# Patient Record
Sex: Female | Born: 1945
Health system: Southern US, Community
[De-identification: ages and names within clinical notes are randomized; demographics above are authoritative.]

## PROBLEM LIST (undated history)

## (undated) DIAGNOSIS — I10 Essential (primary) hypertension: Secondary | ICD-10-CM

## (undated) DIAGNOSIS — E119 Type 2 diabetes mellitus without complications: Secondary | ICD-10-CM

## (undated) DIAGNOSIS — I2510 Atherosclerotic heart disease of native coronary artery without angina pectoris: Secondary | ICD-10-CM

## (undated) DIAGNOSIS — E785 Hyperlipidemia, unspecified: Secondary | ICD-10-CM

## (undated) HISTORY — DX: Type 2 diabetes mellitus without complications: E11.9

## (undated) HISTORY — DX: Essential (primary) hypertension: I10

## (undated) HISTORY — DX: Atherosclerotic heart disease of native coronary artery without angina pectoris: I25.10

## (undated) HISTORY — DX: Hyperlipidemia, unspecified: E78.5

---

## 2012-02-10 ENCOUNTER — Other Ambulatory Visit: Payer: Self-pay | Admitting: Physician Assistant

## 2012-05-28 ENCOUNTER — Other Ambulatory Visit: Payer: Self-pay | Admitting: Physician Assistant

## 2012-05-28 NOTE — Telephone Encounter (Signed)
This is a not a patient here at [facility].

## 2012-05-28 NOTE — Telephone Encounter (Signed)
Please pull patient chart 

## 2016-09-11 DIAGNOSIS — Z09 Encounter for follow-up examination after completed treatment for conditions other than malignant neoplasm: Secondary | ICD-10-CM | POA: Diagnosis not present

## 2016-09-11 DIAGNOSIS — R920 Mammographic microcalcification found on diagnostic imaging of breast: Secondary | ICD-10-CM | POA: Diagnosis not present

## 2016-09-11 DIAGNOSIS — R928 Other abnormal and inconclusive findings on diagnostic imaging of breast: Secondary | ICD-10-CM | POA: Diagnosis not present

## 2016-09-17 DIAGNOSIS — Z85828 Personal history of other malignant neoplasm of skin: Secondary | ICD-10-CM | POA: Diagnosis not present

## 2016-09-17 DIAGNOSIS — D225 Melanocytic nevi of trunk: Secondary | ICD-10-CM | POA: Diagnosis not present

## 2016-09-17 DIAGNOSIS — L814 Other melanin hyperpigmentation: Secondary | ICD-10-CM | POA: Diagnosis not present

## 2016-09-17 DIAGNOSIS — L821 Other seborrheic keratosis: Secondary | ICD-10-CM | POA: Diagnosis not present

## 2016-09-17 DIAGNOSIS — D1801 Hemangioma of skin and subcutaneous tissue: Secondary | ICD-10-CM | POA: Diagnosis not present

## 2016-10-18 DIAGNOSIS — J111 Influenza due to unidentified influenza virus with other respiratory manifestations: Secondary | ICD-10-CM | POA: Diagnosis not present

## 2016-11-08 DIAGNOSIS — H524 Presbyopia: Secondary | ICD-10-CM | POA: Diagnosis not present

## 2016-11-08 DIAGNOSIS — H2513 Age-related nuclear cataract, bilateral: Secondary | ICD-10-CM | POA: Diagnosis not present

## 2016-11-08 DIAGNOSIS — E119 Type 2 diabetes mellitus without complications: Secondary | ICD-10-CM | POA: Diagnosis not present

## 2016-11-08 DIAGNOSIS — H5203 Hypermetropia, bilateral: Secondary | ICD-10-CM | POA: Diagnosis not present

## 2016-11-08 DIAGNOSIS — H52223 Regular astigmatism, bilateral: Secondary | ICD-10-CM | POA: Diagnosis not present

## 2016-11-08 DIAGNOSIS — H33052 Total retinal detachment, left eye: Secondary | ICD-10-CM | POA: Diagnosis not present

## 2016-11-08 DIAGNOSIS — H40052 Ocular hypertension, left eye: Secondary | ICD-10-CM | POA: Diagnosis not present

## 2017-01-07 DIAGNOSIS — R8290 Unspecified abnormal findings in urine: Secondary | ICD-10-CM | POA: Diagnosis not present

## 2017-01-07 DIAGNOSIS — I10 Essential (primary) hypertension: Secondary | ICD-10-CM | POA: Diagnosis not present

## 2017-01-07 DIAGNOSIS — E119 Type 2 diabetes mellitus without complications: Secondary | ICD-10-CM | POA: Diagnosis not present

## 2017-01-07 DIAGNOSIS — E782 Mixed hyperlipidemia: Secondary | ICD-10-CM | POA: Diagnosis not present

## 2017-01-07 DIAGNOSIS — R69 Illness, unspecified: Secondary | ICD-10-CM | POA: Diagnosis not present

## 2017-01-07 DIAGNOSIS — E669 Obesity, unspecified: Secondary | ICD-10-CM | POA: Diagnosis not present

## 2017-01-07 DIAGNOSIS — R319 Hematuria, unspecified: Secondary | ICD-10-CM | POA: Diagnosis not present

## 2017-02-05 DIAGNOSIS — R319 Hematuria, unspecified: Secondary | ICD-10-CM | POA: Diagnosis not present

## 2017-02-05 DIAGNOSIS — N390 Urinary tract infection, site not specified: Secondary | ICD-10-CM | POA: Diagnosis not present

## 2017-02-18 DIAGNOSIS — H2513 Age-related nuclear cataract, bilateral: Secondary | ICD-10-CM | POA: Diagnosis not present

## 2017-02-18 DIAGNOSIS — H33052 Total retinal detachment, left eye: Secondary | ICD-10-CM | POA: Diagnosis not present

## 2017-02-18 DIAGNOSIS — H40052 Ocular hypertension, left eye: Secondary | ICD-10-CM | POA: Diagnosis not present

## 2017-02-28 DIAGNOSIS — N390 Urinary tract infection, site not specified: Secondary | ICD-10-CM | POA: Diagnosis not present

## 2017-07-23 DIAGNOSIS — H02824 Cysts of left upper eyelid: Secondary | ICD-10-CM | POA: Diagnosis not present

## 2017-07-23 DIAGNOSIS — Z23 Encounter for immunization: Secondary | ICD-10-CM | POA: Diagnosis not present

## 2017-07-23 DIAGNOSIS — I10 Essential (primary) hypertension: Secondary | ICD-10-CM | POA: Diagnosis not present

## 2017-07-23 DIAGNOSIS — E119 Type 2 diabetes mellitus without complications: Secondary | ICD-10-CM | POA: Diagnosis not present

## 2017-07-23 DIAGNOSIS — E782 Mixed hyperlipidemia: Secondary | ICD-10-CM | POA: Diagnosis not present

## 2017-07-30 DIAGNOSIS — H33052 Total retinal detachment, left eye: Secondary | ICD-10-CM | POA: Diagnosis not present

## 2017-07-30 DIAGNOSIS — H40052 Ocular hypertension, left eye: Secondary | ICD-10-CM | POA: Diagnosis not present

## 2017-07-30 DIAGNOSIS — H2513 Age-related nuclear cataract, bilateral: Secondary | ICD-10-CM | POA: Diagnosis not present

## 2017-07-30 DIAGNOSIS — H0014 Chalazion left upper eyelid: Secondary | ICD-10-CM | POA: Diagnosis not present

## 2017-09-23 DIAGNOSIS — L309 Dermatitis, unspecified: Secondary | ICD-10-CM | POA: Diagnosis not present

## 2017-09-23 DIAGNOSIS — D1801 Hemangioma of skin and subcutaneous tissue: Secondary | ICD-10-CM | POA: Diagnosis not present

## 2017-09-23 DIAGNOSIS — Z23 Encounter for immunization: Secondary | ICD-10-CM | POA: Diagnosis not present

## 2017-09-23 DIAGNOSIS — L821 Other seborrheic keratosis: Secondary | ICD-10-CM | POA: Diagnosis not present

## 2017-09-23 DIAGNOSIS — Z85828 Personal history of other malignant neoplasm of skin: Secondary | ICD-10-CM | POA: Diagnosis not present

## 2017-09-23 DIAGNOSIS — L814 Other melanin hyperpigmentation: Secondary | ICD-10-CM | POA: Diagnosis not present

## 2017-09-23 DIAGNOSIS — D225 Melanocytic nevi of trunk: Secondary | ICD-10-CM | POA: Diagnosis not present

## 2017-10-27 DIAGNOSIS — E119 Type 2 diabetes mellitus without complications: Secondary | ICD-10-CM | POA: Diagnosis not present

## 2017-10-28 DIAGNOSIS — E782 Mixed hyperlipidemia: Secondary | ICD-10-CM | POA: Diagnosis not present

## 2017-10-28 DIAGNOSIS — E119 Type 2 diabetes mellitus without complications: Secondary | ICD-10-CM | POA: Diagnosis not present

## 2017-10-28 DIAGNOSIS — I10 Essential (primary) hypertension: Secondary | ICD-10-CM | POA: Diagnosis not present

## 2017-10-28 DIAGNOSIS — Z7984 Long term (current) use of oral hypoglycemic drugs: Secondary | ICD-10-CM | POA: Diagnosis not present

## 2017-10-28 DIAGNOSIS — M2560 Stiffness of unspecified joint, not elsewhere classified: Secondary | ICD-10-CM | POA: Diagnosis not present

## 2017-11-18 DIAGNOSIS — H33052 Total retinal detachment, left eye: Secondary | ICD-10-CM | POA: Diagnosis not present

## 2017-11-18 DIAGNOSIS — H2513 Age-related nuclear cataract, bilateral: Secondary | ICD-10-CM | POA: Diagnosis not present

## 2017-11-18 DIAGNOSIS — H35033 Hypertensive retinopathy, bilateral: Secondary | ICD-10-CM | POA: Diagnosis not present

## 2017-11-18 DIAGNOSIS — H524 Presbyopia: Secondary | ICD-10-CM | POA: Diagnosis not present

## 2017-11-18 DIAGNOSIS — H40052 Ocular hypertension, left eye: Secondary | ICD-10-CM | POA: Diagnosis not present

## 2017-11-25 DIAGNOSIS — Z1231 Encounter for screening mammogram for malignant neoplasm of breast: Secondary | ICD-10-CM | POA: Diagnosis not present

## 2018-02-27 DIAGNOSIS — Z6837 Body mass index (BMI) 37.0-37.9, adult: Secondary | ICD-10-CM | POA: Diagnosis not present

## 2018-02-27 DIAGNOSIS — E1169 Type 2 diabetes mellitus with other specified complication: Secondary | ICD-10-CM | POA: Diagnosis not present

## 2018-02-27 DIAGNOSIS — Z79899 Other long term (current) drug therapy: Secondary | ICD-10-CM | POA: Diagnosis not present

## 2018-02-27 DIAGNOSIS — E7800 Pure hypercholesterolemia, unspecified: Secondary | ICD-10-CM | POA: Diagnosis not present

## 2018-02-27 DIAGNOSIS — R69 Illness, unspecified: Secondary | ICD-10-CM | POA: Diagnosis not present

## 2018-02-27 DIAGNOSIS — I10 Essential (primary) hypertension: Secondary | ICD-10-CM | POA: Diagnosis not present

## 2018-02-27 DIAGNOSIS — Z23 Encounter for immunization: Secondary | ICD-10-CM | POA: Diagnosis not present

## 2018-02-27 DIAGNOSIS — J301 Allergic rhinitis due to pollen: Secondary | ICD-10-CM | POA: Diagnosis not present

## 2018-02-27 DIAGNOSIS — M545 Low back pain: Secondary | ICD-10-CM | POA: Diagnosis not present

## 2018-06-18 DIAGNOSIS — E1169 Type 2 diabetes mellitus with other specified complication: Secondary | ICD-10-CM | POA: Diagnosis not present

## 2018-06-18 DIAGNOSIS — E538 Deficiency of other specified B group vitamins: Secondary | ICD-10-CM | POA: Diagnosis not present

## 2018-06-18 DIAGNOSIS — E7800 Pure hypercholesterolemia, unspecified: Secondary | ICD-10-CM | POA: Diagnosis not present

## 2018-06-18 DIAGNOSIS — Z23 Encounter for immunization: Secondary | ICD-10-CM | POA: Diagnosis not present

## 2018-06-18 DIAGNOSIS — M8580 Other specified disorders of bone density and structure, unspecified site: Secondary | ICD-10-CM | POA: Diagnosis not present

## 2018-06-18 DIAGNOSIS — I10 Essential (primary) hypertension: Secondary | ICD-10-CM | POA: Diagnosis not present

## 2018-06-18 DIAGNOSIS — Z6837 Body mass index (BMI) 37.0-37.9, adult: Secondary | ICD-10-CM | POA: Diagnosis not present

## 2018-06-18 DIAGNOSIS — K219 Gastro-esophageal reflux disease without esophagitis: Secondary | ICD-10-CM | POA: Diagnosis not present

## 2018-09-29 DIAGNOSIS — D225 Melanocytic nevi of trunk: Secondary | ICD-10-CM | POA: Diagnosis not present

## 2018-09-29 DIAGNOSIS — C44612 Basal cell carcinoma of skin of right upper limb, including shoulder: Secondary | ICD-10-CM | POA: Diagnosis not present

## 2018-09-29 DIAGNOSIS — Z23 Encounter for immunization: Secondary | ICD-10-CM | POA: Diagnosis not present

## 2018-09-29 DIAGNOSIS — C4401 Basal cell carcinoma of skin of lip: Secondary | ICD-10-CM | POA: Diagnosis not present

## 2018-09-29 DIAGNOSIS — D485 Neoplasm of uncertain behavior of skin: Secondary | ICD-10-CM | POA: Diagnosis not present

## 2018-09-29 DIAGNOSIS — Z85828 Personal history of other malignant neoplasm of skin: Secondary | ICD-10-CM | POA: Diagnosis not present

## 2018-09-29 DIAGNOSIS — L570 Actinic keratosis: Secondary | ICD-10-CM | POA: Diagnosis not present

## 2018-09-29 DIAGNOSIS — L821 Other seborrheic keratosis: Secondary | ICD-10-CM | POA: Diagnosis not present

## 2018-09-29 DIAGNOSIS — L814 Other melanin hyperpigmentation: Secondary | ICD-10-CM | POA: Diagnosis not present

## 2018-10-26 DIAGNOSIS — C44612 Basal cell carcinoma of skin of right upper limb, including shoulder: Secondary | ICD-10-CM | POA: Diagnosis not present

## 2018-10-26 DIAGNOSIS — D485 Neoplasm of uncertain behavior of skin: Secondary | ICD-10-CM | POA: Diagnosis not present

## 2018-10-26 DIAGNOSIS — L905 Scar conditions and fibrosis of skin: Secondary | ICD-10-CM | POA: Diagnosis not present

## 2018-12-21 DIAGNOSIS — L905 Scar conditions and fibrosis of skin: Secondary | ICD-10-CM | POA: Diagnosis not present

## 2018-12-21 DIAGNOSIS — C44612 Basal cell carcinoma of skin of right upper limb, including shoulder: Secondary | ICD-10-CM | POA: Diagnosis not present

## 2018-12-29 DIAGNOSIS — M545 Low back pain: Secondary | ICD-10-CM | POA: Diagnosis not present

## 2018-12-29 DIAGNOSIS — Z85828 Personal history of other malignant neoplasm of skin: Secondary | ICD-10-CM | POA: Diagnosis not present

## 2018-12-29 DIAGNOSIS — I10 Essential (primary) hypertension: Secondary | ICD-10-CM | POA: Diagnosis not present

## 2018-12-29 DIAGNOSIS — E1169 Type 2 diabetes mellitus with other specified complication: Secondary | ICD-10-CM | POA: Diagnosis not present

## 2018-12-29 DIAGNOSIS — Z7984 Long term (current) use of oral hypoglycemic drugs: Secondary | ICD-10-CM | POA: Diagnosis not present

## 2018-12-29 DIAGNOSIS — Z974 Presence of external hearing-aid: Secondary | ICD-10-CM | POA: Diagnosis not present

## 2018-12-29 DIAGNOSIS — E7800 Pure hypercholesterolemia, unspecified: Secondary | ICD-10-CM | POA: Diagnosis not present

## 2019-01-28 DIAGNOSIS — C4401 Basal cell carcinoma of skin of lip: Secondary | ICD-10-CM | POA: Diagnosis not present

## 2019-03-03 DIAGNOSIS — H2513 Age-related nuclear cataract, bilateral: Secondary | ICD-10-CM | POA: Diagnosis not present

## 2019-03-03 DIAGNOSIS — E119 Type 2 diabetes mellitus without complications: Secondary | ICD-10-CM | POA: Diagnosis not present

## 2019-03-03 DIAGNOSIS — H33052 Total retinal detachment, left eye: Secondary | ICD-10-CM | POA: Diagnosis not present

## 2019-03-03 DIAGNOSIS — H40052 Ocular hypertension, left eye: Secondary | ICD-10-CM | POA: Diagnosis not present

## 2019-03-29 DIAGNOSIS — R69 Illness, unspecified: Secondary | ICD-10-CM | POA: Diagnosis not present

## 2019-04-20 DIAGNOSIS — Z1239 Encounter for other screening for malignant neoplasm of breast: Secondary | ICD-10-CM | POA: Diagnosis not present

## 2019-04-20 DIAGNOSIS — Z1231 Encounter for screening mammogram for malignant neoplasm of breast: Secondary | ICD-10-CM | POA: Diagnosis not present

## 2019-05-20 DIAGNOSIS — R69 Illness, unspecified: Secondary | ICD-10-CM | POA: Diagnosis not present

## 2019-06-15 DIAGNOSIS — K219 Gastro-esophageal reflux disease without esophagitis: Secondary | ICD-10-CM | POA: Diagnosis not present

## 2019-06-15 DIAGNOSIS — Z79899 Other long term (current) drug therapy: Secondary | ICD-10-CM | POA: Diagnosis not present

## 2019-06-15 DIAGNOSIS — E1169 Type 2 diabetes mellitus with other specified complication: Secondary | ICD-10-CM | POA: Diagnosis not present

## 2019-06-15 DIAGNOSIS — Z6838 Body mass index (BMI) 38.0-38.9, adult: Secondary | ICD-10-CM | POA: Diagnosis not present

## 2019-06-15 DIAGNOSIS — E7800 Pure hypercholesterolemia, unspecified: Secondary | ICD-10-CM | POA: Diagnosis not present

## 2019-06-15 DIAGNOSIS — Z1159 Encounter for screening for other viral diseases: Secondary | ICD-10-CM | POA: Diagnosis not present

## 2019-06-15 DIAGNOSIS — I10 Essential (primary) hypertension: Secondary | ICD-10-CM | POA: Diagnosis not present

## 2019-09-07 ENCOUNTER — Ambulatory Visit: Payer: Medicare Other | Attending: Internal Medicine

## 2019-09-07 DIAGNOSIS — Z23 Encounter for immunization: Secondary | ICD-10-CM | POA: Insufficient documentation

## 2019-09-07 NOTE — Progress Notes (Signed)
   Covid-19 Vaccination Clinic  Name:  Cindy Bautista    MRN: [id number] DOB: 07-20-1946  09/07/2019  [name] was observed post Covid-19 immunization for 15 minutes without incidence. She was provided with Vaccine Information Sheet and instruction to access the V-Safe system.   [name] was instructed to call 911 with any severe reactions post vaccine: Marland Kitchen Difficulty breathing  . Swelling of your face and throat  . A fast heartbeat  . A bad rash all over your body  . Dizziness and weakness    Immunizations Administered    Name Date Dose VIS Date Route   Pfizer COVID-19 Vaccine 09/07/2019  3:48 PM 0.3 mL 07/30/2019 Intramuscular   Manufacturer: [place]   Lot: [id number]   [place]: [id number]

## 2019-09-28 ENCOUNTER — Ambulatory Visit: Payer: Self-pay

## 2019-09-29 ENCOUNTER — Ambulatory Visit: Payer: Medicare HMO | Attending: Internal Medicine

## 2019-09-29 DIAGNOSIS — Z23 Encounter for immunization: Secondary | ICD-10-CM | POA: Insufficient documentation

## 2019-09-29 NOTE — Progress Notes (Signed)
   Covid-19 Vaccination Clinic  Name:  DANYELLE KEAL    MRN: [id number] DOB: Mar 26, 1946  09/29/2019  Ms. Turlington was observed post Covid-19 immunization for 15 minutes without incidence. She was provided with Vaccine Information Sheet and instruction to access the V-Safe system.   Ms. Totzke was instructed to call 911 with any severe reactions post vaccine: Marland Kitchen Difficulty breathing  . Swelling of your face and throat  . A fast heartbeat  . A bad rash all over your body  . Dizziness and weakness    Immunizations Administered    Name Date Dose VIS Date Route   Pfizer COVID-19 Vaccine 09/29/2019  8:31 AM 0.3 mL 07/30/2019 Intramuscular   Manufacturer: [place]   Lot: [id number]   [place]: [id number]

## 2019-10-05 DIAGNOSIS — R69 Illness, unspecified: Secondary | ICD-10-CM | POA: Diagnosis not present

## 2019-10-20 DIAGNOSIS — H31092 Other chorioretinal scars, left eye: Secondary | ICD-10-CM | POA: Diagnosis not present

## 2019-10-20 DIAGNOSIS — E119 Type 2 diabetes mellitus without complications: Secondary | ICD-10-CM | POA: Diagnosis not present

## 2019-10-20 DIAGNOSIS — H2513 Age-related nuclear cataract, bilateral: Secondary | ICD-10-CM | POA: Diagnosis not present

## 2019-10-20 DIAGNOSIS — H35033 Hypertensive retinopathy, bilateral: Secondary | ICD-10-CM | POA: Diagnosis not present

## 2019-11-02 DIAGNOSIS — H2513 Age-related nuclear cataract, bilateral: Secondary | ICD-10-CM | POA: Diagnosis not present

## 2019-11-02 DIAGNOSIS — H2512 Age-related nuclear cataract, left eye: Secondary | ICD-10-CM | POA: Diagnosis not present

## 2019-11-16 DIAGNOSIS — H2512 Age-related nuclear cataract, left eye: Secondary | ICD-10-CM | POA: Diagnosis not present

## 2019-11-17 DIAGNOSIS — D485 Neoplasm of uncertain behavior of skin: Secondary | ICD-10-CM | POA: Diagnosis not present

## 2019-11-17 DIAGNOSIS — L578 Other skin changes due to chronic exposure to nonionizing radiation: Secondary | ICD-10-CM | POA: Diagnosis not present

## 2019-11-17 DIAGNOSIS — D225 Melanocytic nevi of trunk: Secondary | ICD-10-CM | POA: Diagnosis not present

## 2019-11-17 DIAGNOSIS — Z85828 Personal history of other malignant neoplasm of skin: Secondary | ICD-10-CM | POA: Diagnosis not present

## 2019-11-17 DIAGNOSIS — C44519 Basal cell carcinoma of skin of other part of trunk: Secondary | ICD-10-CM | POA: Diagnosis not present

## 2019-11-17 DIAGNOSIS — L821 Other seborrheic keratosis: Secondary | ICD-10-CM | POA: Diagnosis not present

## 2019-11-17 DIAGNOSIS — L814 Other melanin hyperpigmentation: Secondary | ICD-10-CM | POA: Diagnosis not present

## 2019-11-17 DIAGNOSIS — C44619 Basal cell carcinoma of skin of left upper limb, including shoulder: Secondary | ICD-10-CM | POA: Diagnosis not present

## 2019-11-22 DIAGNOSIS — H2511 Age-related nuclear cataract, right eye: Secondary | ICD-10-CM | POA: Diagnosis not present

## 2019-11-30 DIAGNOSIS — H2511 Age-related nuclear cataract, right eye: Secondary | ICD-10-CM | POA: Diagnosis not present

## 2019-11-30 DIAGNOSIS — H25811 Combined forms of age-related cataract, right eye: Secondary | ICD-10-CM | POA: Diagnosis not present

## 2019-12-02 DIAGNOSIS — L905 Scar conditions and fibrosis of skin: Secondary | ICD-10-CM | POA: Diagnosis not present

## 2019-12-13 DIAGNOSIS — C44519 Basal cell carcinoma of skin of other part of trunk: Secondary | ICD-10-CM | POA: Diagnosis not present

## 2019-12-13 DIAGNOSIS — L820 Inflamed seborrheic keratosis: Secondary | ICD-10-CM | POA: Diagnosis not present

## 2019-12-14 DIAGNOSIS — R0789 Other chest pain: Secondary | ICD-10-CM | POA: Diagnosis not present

## 2019-12-14 DIAGNOSIS — Z974 Presence of external hearing-aid: Secondary | ICD-10-CM | POA: Diagnosis not present

## 2019-12-14 DIAGNOSIS — I10 Essential (primary) hypertension: Secondary | ICD-10-CM | POA: Diagnosis not present

## 2019-12-14 DIAGNOSIS — E1169 Type 2 diabetes mellitus with other specified complication: Secondary | ICD-10-CM | POA: Diagnosis not present

## 2019-12-14 DIAGNOSIS — E7800 Pure hypercholesterolemia, unspecified: Secondary | ICD-10-CM | POA: Diagnosis not present

## 2019-12-14 DIAGNOSIS — K219 Gastro-esophageal reflux disease without esophagitis: Secondary | ICD-10-CM | POA: Diagnosis not present

## 2019-12-23 NOTE — Progress Notes (Signed)
Cardiology Office Note:   Date:  12/24/2019  NAME:  Cindy Bautista    MRN: [id number] DOB:  08-Nov-1945   PCP:  Kathyrn Lass, MD  Cardiologist:  No primary care provider on file.   Referring MD: Kathyrn Lass, MD   Chief Complaint  Patient presents with  . Chest Pain   History of Present Illness:   Cindy Bautista is a 74 y.o. female with a hx of DM, HTN, HLD who is being seen today for the evaluation of chest pain at the request of Kathyrn Lass, MD.  She is a long history of diabetes.  She is on oral hypoglycemic agents with an A1c of eight.  She reports over the last 2 to 3 months she is developed worsening shortness of breath with heavy exertion.  She reports activity such as climbing hills do get her quite winded.  She apparently has been working out pretty extensively during the coronavirus pandemic.  This includes walking 2 to 2.5 miles per day three times a week.  She also does Zumba three times a week.  She reports that when she does heavy exertion she does get winded easily.  She is also had some tightness in her chest that is associated with heavy exertion.  Given her diabetes and family history of heart disease, she is quite concerned about her heart.  Her EKG today demonstrates an incomplete right bundle branch block with left axis deviation.  She reports that her son had a heart attack at age 22.  She also has several family members who had heart disease around her age.  Overall she is concerned.  She is a never smoker.  She consumes alcohol in moderation.  She does not use any illicit drugs.  She is active.  Diabetes and LDL management reviewed below.  She is on a statin.  Problem List 1. HTN 2. DM -A1c 8.0 3. HLD -Total cholesterol 158, triglycerides 212, LDL 72, HDL 115  Past Medical History: Past Medical History:  Diagnosis Date  . Diabetes mellitus without complication ([place])   . Hyperlipidemia   . Hypertension     Past Surgical History: Past Surgical History:   Procedure Laterality Date  . CESAREAN SECTION      Current Medications: Current Meds  Medication Sig  . metFORMIN (GLUCOPHAGE-XR) 500 MG 24 hr tablet Take by mouth.  . [DISCONTINUED] losartan-hydrochlorothiazide (HYZAAR) 50-12.5 MG tablet Take by mouth.  . [DISCONTINUED] meloxicam (MOBIC) 7.5 MG tablet Take by mouth.     Allergies:    Patient has no allergy information on record.   Social History: Social History   Socioeconomic History  . Marital status: Divorced    Spouse name: Not on file  . Number of children: Not on file  . Years of education: Not on file  . Highest education level: Not on file  Occupational History  . Occupation: physical therapist  Tobacco Use  . Smoking status: Never Smoker  . Smokeless tobacco: Never Used  Substance and Sexual Activity  . Alcohol use: Not Currently  . Drug use: Never  . Sexual activity: Not on file  Other Topics Concern  . Not on file  Social History Narrative  . Not on file   Social Determinants of Health   Financial Resource Strain:   . Difficulty of Paying Living Expenses:   Food Insecurity:   . Worried About Charity fundraiser in the Last Year:   . [place] in the Last  Year:   Transportation Needs:   . Film/video editor (Medical):   Marland Kitchen Lack of Transportation (Non-Medical):   Physical Activity:   . Days of Exercise per Week:   . Minutes of Exercise per Session:   Stress:   . Feeling of Stress :   Social Connections:   . Frequency of Communication with Friends and Family:   . Frequency of Social Gatherings with Friends and Family:   . Attends Religious Services:   . Active Member of Clubs or Organizations:   . Attends Archivist Meetings:   Marland Kitchen Marital Status:      Family History: The patient's family history includes Diabetes in her father; Heart attack in her son; Heart disease in her mother; Heart disease (age of onset: 40) in her father; Heart failure in her mother; Hyperlipidemia in  her father.  ROS:   All other ROS reviewed and negative. Pertinent positives noted in the HPI.     EKGs/Labs/Other Studies Reviewed:   The following studies were personally reviewed by me today:  EKG:  EKG is ordered today.  The ekg ordered today demonstrates normal sinus rhythm, incomplete right bundle branch block with left axis deviation, normal intervals, no acute ischemic changes, notes prior infarction, and was personally reviewed by me.   Recent Labs: No results found for requested labs within last 8760 hours.   Recent Lipid Panel No results found for: CHOL, TRIG, HDL, CHOLHDL, VLDL, LDLCALC, LDLDIRECT  Physical Exam:   VS:  BP 135/70 (BP Location: Left Arm)   Pulse 69   Temp (!) 96.6 F (35.9 C)   Ht 5\' 3"  (1.6 m)   Wt 214 lb 9.6 oz (97.3 kg)   SpO2 97%   BMI 38.01 kg/m    Wt Readings from Last 3 Encounters:  12/24/19 214 lb 9.6 oz (97.3 kg)    General: Well nourished, well developed, in no acute distress Heart: Atraumatic, normal size  Eyes: PEERLA, EOMI  Neck: Supple, no JVD Endocrine: No thryomegaly Cardiac: Normal S1, S2; RRR; no murmurs, rubs, or gallops Lungs: Clear to auscultation bilaterally, no wheezing, rhonchi or rales  Abd: Soft, nontender, no hepatomegaly  Ext: No edema, pulses 2+ Musculoskeletal: No deformities, BUE and BLE strength normal and equal Skin: Warm and dry, no rashes   Neuro: Alert and oriented to person, place, time, and situation, CNII-XII grossly intact, no focal deficits  Psych: Normal mood and affect   ASSESSMENT:   Cindy Bautista is a 73 y.o. female who presents for the following: 1. Chest pain, unspecified type   2. Essential hypertension   3. Mixed hyperlipidemia     PLAN:   1. Chest pain, unspecified type -She reports exertional shortness of breath and chest tightness.  CVD risk factors include diabetes, hypertension, hyperlipidemia.  She also has a strong family history.  I think it is reasonable to obtain an  echocardiogram to ensure heart structurally normal.  I hear no murmurs on examination.  I have also recommended a cardiac CTA to exclude obstructive CAD.  She will obtain a BMP today and then we will give her metoprolol tartrate 100 mg to take 2 hours before the scan.  We will then see her back in 3 months after the scan.  2. Essential hypertension -Well-controlled today.  No change in medication.  3. Mixed hyperlipidemia -Continue statin therapy.  We will likely switch her over to high intensity based on what we see on the scan.   Disposition: Return  in about 3 months (around 03/25/2020).  Medication Adjustments/Labs and Tests Ordered: Current medicines are reviewed at length with the patient today.  Concerns regarding medicines are outlined above.  Orders Placed This Encounter  Procedures  . CT CORONARY MORPH W/CTA COR W/SCORE W/CA W/CM &/OR WO/CM  . CT CORONARY FRACTIONAL FLOW RESERVE DATA PREP  . CT CORONARY FRACTIONAL FLOW RESERVE FLUID ANALYSIS  . Basic metabolic panel  . EKG 12-Lead  . ECHOCARDIOGRAM COMPLETE   Meds ordered this encounter  Medications  . metoprolol tartrate (LOPRESSOR) 100 MG tablet    Sig: Take 1 tablet by mouth once for procedure.    Dispense:  1 tablet    Refill:  0    Patient Instructions  Medication Instructions:  Take Metoprolol 100 mg two hours before CTA   *If you need a refill on your cardiac medications before your next appointment, please call your pharmacy*   Lab Work: BMET today   If you have labs (blood work) drawn today and your tests are completely normal, you will receive your results only by: Marland Kitchen MyChart Message (if you have MyChart) OR . A paper copy in the mail If you have any lab test that is abnormal or we need to change your treatment, we will call you to review the results.   Testing/Procedures: Echocardiogram - Your physician has requested that you have an echocardiogram. Echocardiography is a painless test that uses sound  waves to create images of your heart. It provides your doctor with information about the size and shape of your heart and how well your heart's chambers and valves are working. This procedure takes approximately one hour. There are no restrictions for this procedure. This will be performed at our [facility] location - [address], [address].  Your physician has requested that you have cardiac CT. Cardiac computed tomography (CT) is a painless test that uses an x-ray machine to take clear, detailed pictures of your heart. For further information please visit [email or web address]. Please follow instruction sheet as given.   Follow-Up: At [facility], you and your health needs are our priority.  As part of our continuing mission to provide you with exceptional heart care, we have created designated Provider Care Teams.  These Care Teams include your primary Cardiologist (physician) and Advanced Practice Providers (APPs -  Physician Assistants and Nurse Practitioners) who all work together to provide you with the care you need, when you need it.  We recommend signing up for the patient portal called "MyChart".  Sign up information is provided on this After Visit Summary.  MyChart is used to connect with patients for Virtual Visits (Telemedicine).  Patients are able to view lab/test results, encounter notes, upcoming appointments, etc.  Non-urgent messages can be sent to your provider as well.   To learn more about what you can do with MyChart, go to [email or web address].    Your next appointment:   3 month(s)  The format for your next appointment:   In Person  Provider:   Eleonore Chiquito, MD   Other Instructions Your cardiac CT will be scheduled at one of the below locations:   [facility] [address] [place], [place] [address] [phone]  [place] [address] [place], [place] [address] [phone]  If  scheduled at [facility], please arrive at the [facility] main entrance of [facility] 30 minutes prior to test start time. Proceed to  the [facility] Radiology Department (first floor) to check-in and test prep.  If scheduled at [facility], please arrive 15 mins early for check-in and test prep.  Please follow these instructions carefully (unless otherwise directed):  Hold all erectile dysfunction medications at least 3 days (72 hrs) prior to test.  On the Night Before the Test: . Be sure to Drink plenty of water. . Do not consume any caffeinated/decaffeinated beverages or chocolate 12 hours prior to your test. . Do not take any antihistamines 12 hours prior to your test. . If you take Metformin do not take 24 hours prior to test.   On the Day of the Test: . Drink plenty of water. Do not drink any water within one hour of the test. . Do not eat any food 4 hours prior to the test. . You may take your regular medications prior to the test.  . Take metoprolol (Lopressor) two hours prior to test. . HOLD Furosemide/Hydrochlorothiazide morning of the test. . FEMALES- please wear underwire-free bra if available        After the Test: . Drink plenty of water. . After receiving IV contrast, you may experience a mild flushed feeling. This is normal. . On occasion, you may experience a mild rash up to 24 hours after the test. This is not dangerous. If this occurs, you can take Benadryl 25 mg and increase your fluid intake. . If you experience trouble breathing, this can be serious. If it is severe call 911 IMMEDIATELY. If it is mild, please call our office. . If you take any of these medications: Glipizide/Metformin, Avandament, Glucavance, please do not take 48 hours after completing test unless otherwise instructed.   Once we have confirmed authorization from your insurance company, we will call you to set up a date and time for your test.   For  non-scheduling related questions, please contact the cardiac imaging nurse navigator should you have any questions/concerns: Marchia Bond, RN Navigator Cardiac Imaging [facility] [phone] office  For scheduling needs, including cancellations and rescheduling, please call [phone].       Signed, Addison Naegeli. Audie Box, [place]  [address], [place],  [address] [phone]  12/24/2019 10:26 AM

## 2019-12-24 ENCOUNTER — Ambulatory Visit: Payer: Medicare HMO | Admitting: Cardiovascular Disease

## 2019-12-24 ENCOUNTER — Other Ambulatory Visit: Payer: Self-pay

## 2019-12-24 ENCOUNTER — Encounter: Payer: Self-pay | Admitting: Cardiovascular Disease

## 2019-12-24 VITALS — BP 135/70 | HR 69 | Temp 96.6°F | Ht 63.0 in | Wt 214.6 lb

## 2019-12-24 DIAGNOSIS — I10 Essential (primary) hypertension: Secondary | ICD-10-CM | POA: Diagnosis not present

## 2019-12-24 DIAGNOSIS — E782 Mixed hyperlipidemia: Secondary | ICD-10-CM | POA: Diagnosis not present

## 2019-12-24 DIAGNOSIS — R079 Chest pain, unspecified: Secondary | ICD-10-CM | POA: Diagnosis not present

## 2019-12-24 LAB — BASIC METABOLIC PANEL
BUN/Creatinine Ratio: 25 (ref 12–28)
BUN: 19 mg/dL (ref 8–27)
CO2: 22 mmol/L (ref 20–29)
Calcium: 10.2 mg/dL (ref 8.7–10.3)
Chloride: 102 mmol/L (ref 96–106)
Creatinine, Ser: 0.75 mg/dL (ref 0.57–1.00)
GFR calc Af Amer: 91 mL/min/{1.73_m2} (ref >59)
GFR calc non Af Amer: 79 mL/min/{1.73_m2} (ref >59)
Glucose: 177 mg/dL — ABNORMAL HIGH (ref 65–99)
Potassium: 4.9 mmol/L (ref 3.5–5.2)
Sodium: 140 mmol/L (ref 134–144)

## 2019-12-24 MED ORDER — METOPROLOL TARTRATE 100 MG PO TABS
ORAL_TABLET | ORAL | 0 refills | Status: DC
Start: 2019-12-24 — End: 2023-10-30

## 2019-12-24 NOTE — Patient Instructions (Signed)
Medication Instructions:  Take Metoprolol 100 mg two hours before CTA   *If you need a refill on your cardiac medications before your next appointment, please call your pharmacy*   Lab Work: BMET today   If you have labs (blood work) drawn today and your tests are completely normal, you will receive your results only by: Marland Kitchen MyChart Message (if you have MyChart) OR . A paper copy in the mail If you have any lab test that is abnormal or we need to change your treatment, we will call you to review the results.   Testing/Procedures: Echocardiogram - Your physician has requested that you have an echocardiogram. Echocardiography is a painless test that uses sound waves to create images of your heart. It provides your doctor with information about the size and shape of your heart and how well your heart's chambers and valves are working. This procedure takes approximately one hour. There are no restrictions for this procedure. This will be performed at our [facility] location - [address].  Your physician has requested that you have cardiac CT. Cardiac computed tomography (CT) is a painless test that uses an x-ray machine to take clear, detailed pictures of your heart. For further information please visit [email or web address]. Please follow instruction sheet as given.   Follow-Up: At [facility], you and your health needs are our priority.  As part of our continuing mission to provide you with exceptional heart care, we have created designated Provider Care Teams.  These Care Teams include your primary Cardiologist (physician) and Advanced Practice Providers (APPs -  Physician Assistants and Nurse Practitioners) who all work together to provide you with the care you need, when you need it.  We recommend signing up for the patient portal called "MyChart".  Sign up information is provided on this After Visit Summary.  MyChart is used to connect with patients for Virtual Visits  (Telemedicine).  Patients are able to view lab/test results, encounter notes, upcoming appointments, etc.  Non-urgent messages can be sent to your provider as well.   To learn more about what you can do with MyChart, go to [email or web address].    Your next appointment:   3 month(s)  The format for your next appointment:   In Person  Provider:   Eleonore Chiquito, MD   Other Instructions Your cardiac CT will be scheduled at one of the below locations:   [facility] [address] [place], [place] [address] [phone]  [place] [address] [place], [place] [address] [phone]  If scheduled at [facility], please arrive at the [facility] main entrance of [facility] 30 minutes prior to test start time. Proceed to the [facility] Radiology Department (first floor) to check-in and test prep.  If scheduled at [facility], please arrive 15 mins early for check-in and test prep.  Please follow these instructions carefully (unless otherwise directed):  Hold all erectile dysfunction medications at least 3 days (72 hrs) prior to test.  On the Night Before the Test: . Be sure to Drink plenty of water. . Do not consume any caffeinated/decaffeinated beverages or chocolate 12 hours prior to your test. . Do not take any antihistamines 12 hours prior to your test. . If you take Metformin do not take 24 hours prior to test.   On the Day of the Test: . Drink plenty of water. Do not drink any water  within one hour of the test. . Do not eat any food 4 hours prior to the test. . You may take your regular medications prior to the test.  . Take metoprolol (Lopressor) two hours prior to test. . HOLD Furosemide/Hydrochlorothiazide morning of the test. . FEMALES- please wear underwire-free bra if available        After the Test: . Drink plenty of water. . After receiving IV  contrast, you may experience a mild flushed feeling. This is normal. . On occasion, you may experience a mild rash up to 24 hours after the test. This is not dangerous. If this occurs, you can take Benadryl 25 mg and increase your fluid intake. . If you experience trouble breathing, this can be serious. If it is severe call 911 IMMEDIATELY. If it is mild, please call our office. . If you take any of these medications: Glipizide/Metformin, Avandament, Glucavance, please do not take 48 hours after completing test unless otherwise instructed.   Once we have confirmed authorization from your insurance company, we will call you to set up a date and time for your test.   For non-scheduling related questions, please contact the cardiac imaging nurse navigator should you have any questions/concerns: Marchia Bond, RN Navigator Cardiac Imaging [facility] [phone] office  For scheduling needs, including cancellations and rescheduling, please call [phone].

## 2019-12-30 DIAGNOSIS — Z7984 Long term (current) use of oral hypoglycemic drugs: Secondary | ICD-10-CM | POA: Diagnosis not present

## 2019-12-30 DIAGNOSIS — Z974 Presence of external hearing-aid: Secondary | ICD-10-CM | POA: Diagnosis not present

## 2019-12-30 DIAGNOSIS — R0789 Other chest pain: Secondary | ICD-10-CM | POA: Diagnosis not present

## 2019-12-30 DIAGNOSIS — E1169 Type 2 diabetes mellitus with other specified complication: Secondary | ICD-10-CM | POA: Diagnosis not present

## 2019-12-30 DIAGNOSIS — I10 Essential (primary) hypertension: Secondary | ICD-10-CM | POA: Diagnosis not present

## 2019-12-30 DIAGNOSIS — E7800 Pure hypercholesterolemia, unspecified: Secondary | ICD-10-CM | POA: Diagnosis not present

## 2019-12-30 DIAGNOSIS — K219 Gastro-esophageal reflux disease without esophagitis: Secondary | ICD-10-CM | POA: Diagnosis not present

## 2020-01-04 DIAGNOSIS — E7800 Pure hypercholesterolemia, unspecified: Secondary | ICD-10-CM | POA: Diagnosis not present

## 2020-01-04 DIAGNOSIS — I10 Essential (primary) hypertension: Secondary | ICD-10-CM | POA: Diagnosis not present

## 2020-01-04 DIAGNOSIS — E1169 Type 2 diabetes mellitus with other specified complication: Secondary | ICD-10-CM | POA: Diagnosis not present

## 2020-01-05 DIAGNOSIS — R69 Illness, unspecified: Secondary | ICD-10-CM | POA: Diagnosis not present

## 2020-01-13 DIAGNOSIS — L237 Allergic contact dermatitis due to plants, except food: Secondary | ICD-10-CM | POA: Diagnosis not present

## 2020-01-13 DIAGNOSIS — L905 Scar conditions and fibrosis of skin: Secondary | ICD-10-CM | POA: Diagnosis not present

## 2020-01-13 DIAGNOSIS — L820 Inflamed seborrheic keratosis: Secondary | ICD-10-CM | POA: Diagnosis not present

## 2020-01-14 ENCOUNTER — Other Ambulatory Visit: Payer: Self-pay

## 2020-01-14 ENCOUNTER — Ambulatory Visit (HOSPITAL_COMMUNITY): Payer: Medicare HMO | Attending: Cardiology

## 2020-01-14 DIAGNOSIS — R079 Chest pain, unspecified: Secondary | ICD-10-CM | POA: Diagnosis not present

## 2020-02-02 ENCOUNTER — Other Ambulatory Visit: Payer: Self-pay

## 2020-02-02 DIAGNOSIS — R079 Chest pain, unspecified: Secondary | ICD-10-CM

## 2020-02-03 ENCOUNTER — Other Ambulatory Visit: Payer: Self-pay | Admitting: *Deleted

## 2020-02-03 DIAGNOSIS — R079 Chest pain, unspecified: Secondary | ICD-10-CM | POA: Diagnosis not present

## 2020-02-03 LAB — BASIC METABOLIC PANEL
BUN/Creatinine Ratio: 24 (ref 12–28)
BUN: 18 mg/dL (ref 8–27)
CO2: 24 mmol/L (ref 20–29)
Calcium: 10.1 mg/dL (ref 8.7–10.3)
Chloride: 101 mmol/L (ref 96–106)
Creatinine, Ser: 0.74 mg/dL (ref 0.57–1.00)
GFR calc Af Amer: 92 mL/min/{1.73_m2} (ref >59)
GFR calc non Af Amer: 80 mL/min/{1.73_m2} (ref >59)
Glucose: 130 mg/dL — ABNORMAL HIGH (ref 65–99)
Potassium: 5 mmol/L (ref 3.5–5.2)
Sodium: 143 mmol/L (ref 134–144)

## 2020-02-04 ENCOUNTER — Telehealth (HOSPITAL_COMMUNITY): Payer: Self-pay | Admitting: *Deleted

## 2020-02-04 DIAGNOSIS — E7800 Pure hypercholesterolemia, unspecified: Secondary | ICD-10-CM | POA: Diagnosis not present

## 2020-02-04 DIAGNOSIS — E1169 Type 2 diabetes mellitus with other specified complication: Secondary | ICD-10-CM | POA: Diagnosis not present

## 2020-02-04 DIAGNOSIS — I10 Essential (primary) hypertension: Secondary | ICD-10-CM | POA: Diagnosis not present

## 2020-02-04 NOTE — Telephone Encounter (Signed)
Pt calling about upcoming cardiac imaging study; pt verbalizes understanding of appt date/time, parking situation and where to check in, pre-test NPO status and medications ordered, and verified current allergies; name and call back number provided for further questions should they arise  Cindy Saver RN Navigator Cardiac [place] [facility] [phone] office [phone] cell

## 2020-02-08 ENCOUNTER — Other Ambulatory Visit: Payer: Self-pay

## 2020-02-08 ENCOUNTER — Ambulatory Visit (HOSPITAL_COMMUNITY)
Admission: RE | Admit: 2020-02-08 | Discharge: 2020-02-08 | Disposition: A | Discharge status: Home / Self Care | Payer: Medicare HMO | Source: Ambulatory Visit | Attending: Cardiovascular Disease | Admitting: Cardiovascular Disease

## 2020-02-08 DIAGNOSIS — R079 Chest pain, unspecified: Secondary | ICD-10-CM | POA: Diagnosis not present

## 2020-02-08 DIAGNOSIS — I2510 Atherosclerotic heart disease of native coronary artery without angina pectoris: Secondary | ICD-10-CM | POA: Diagnosis not present

## 2020-02-08 DIAGNOSIS — I700 Atherosclerosis of aorta: Secondary | ICD-10-CM | POA: Diagnosis not present

## 2020-02-08 IMAGING — CT CT HEART MORP W/ CTA COR W/ SCORE W/ CA W/CM &/OR W/O CM
1 of 2 series · 12 of 20 positions shown, 15 images · IV contrast (APPLIED)
Comparison: None.
COMPARISON: None.

Addendum:
EXAM:
OVER-READ INTERPRETATION  CT CHEST

The following report is an over-read performed by radiologist Dr.
Adenyo Ologo [REDACTED] on 02/08/2020. This
over-read does not include interpretation of cardiac or coronary
anatomy or pathology. The coronary calcium score/coronary CTA
interpretation by the cardiologist is attached.
CLINICAL DATA: Chest pain
Cardiac/Coronary CTA
TECHNIQUE: The patient was scanned on a [employer] Force scanner. A 100 kV
prospective scan was triggered in the descending thoracic aorta at
111 HU's. Axial non-contrast 3 mm slices were carried out through
the heart. The data set was analyzed on a dedicated work station and
scored using the Agatson method. Gantry rotation speed was 250 msecs
and collimation was .6 mm. No beta blockade and 0.8 mg of sl NTG was
given. The 3D data set was reconstructed in 5% intervals of the
35-75 % of the R-R cycle. Diastolic phases were analyzed on a
dedicated work station using MPR, MIP and VRT modes. The patient
received 80 cc of contrast.

[Series 14: (id) · axial · 0.39mm/px · z∈[+1007,+1120]mm · 12 of 4452 slices shown, 15 images]
[im 248/4452  vessel]
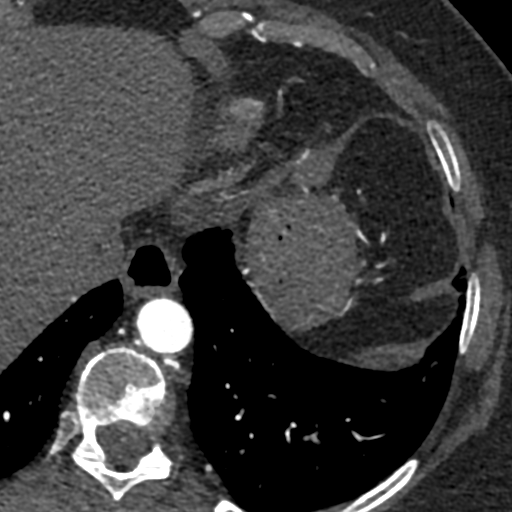
[im 248/4452  lung]
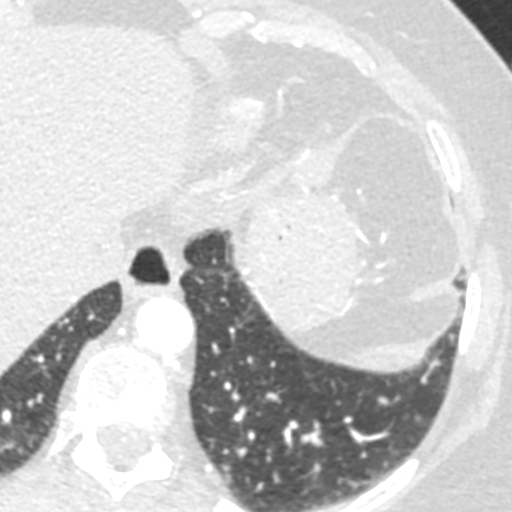
[im 742/4452  vessel]
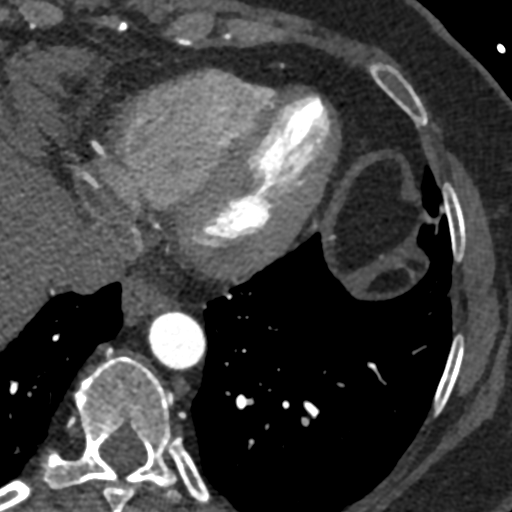
[im 990/4452  vessel]
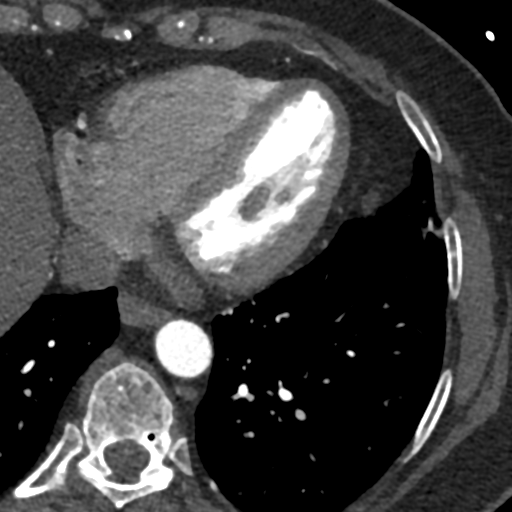
[im 1237/4452  vessel]
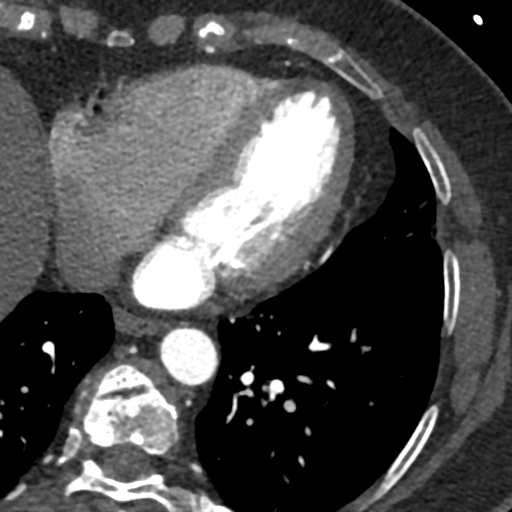
[im 1731/4452  vessel]
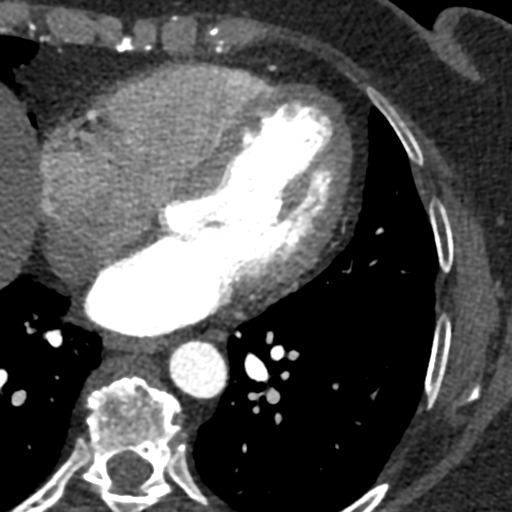
[im 1731/4452  lung]
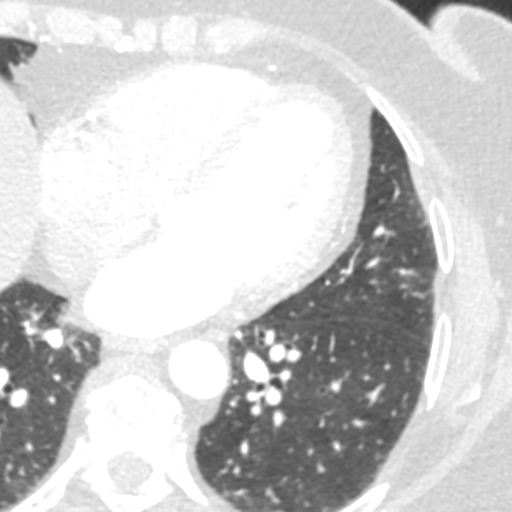
[im 1979/4452  vessel]
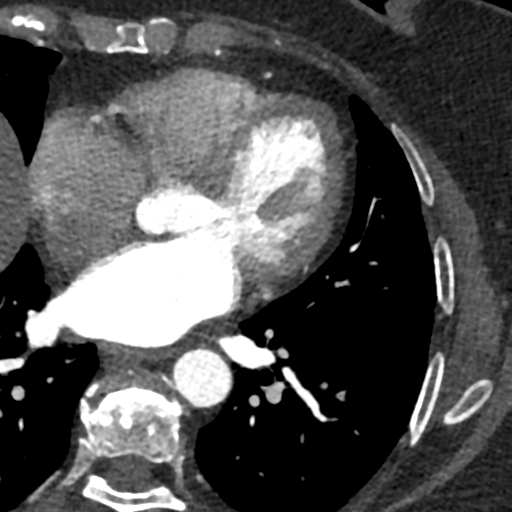
[im 2473/4452  vessel]
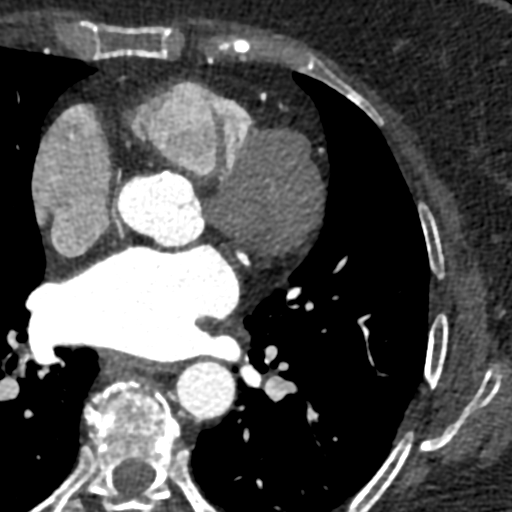
[im 2721/4452  vessel]
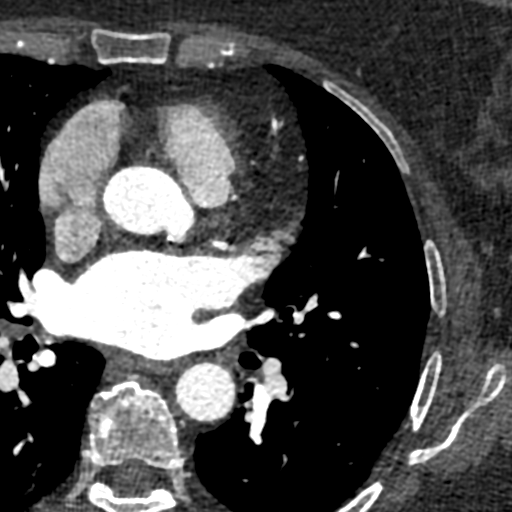
[im 3215/4452  vessel]
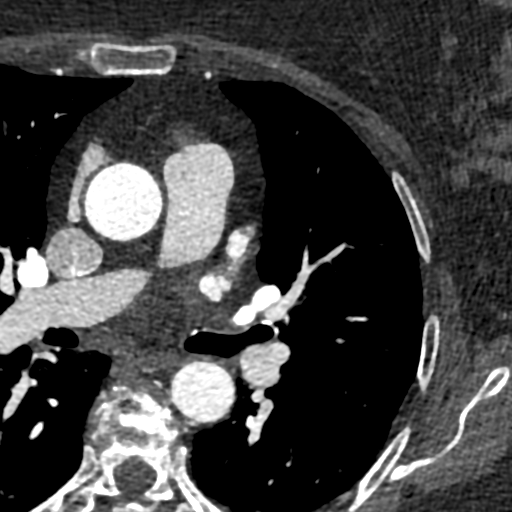
[im 3215/4452  lung]
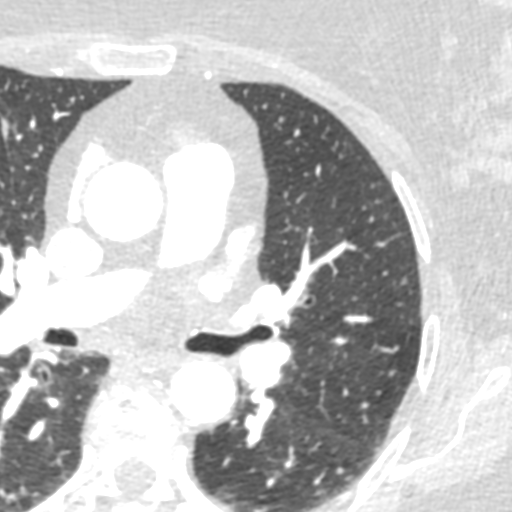
[im 3462/4452  vessel]
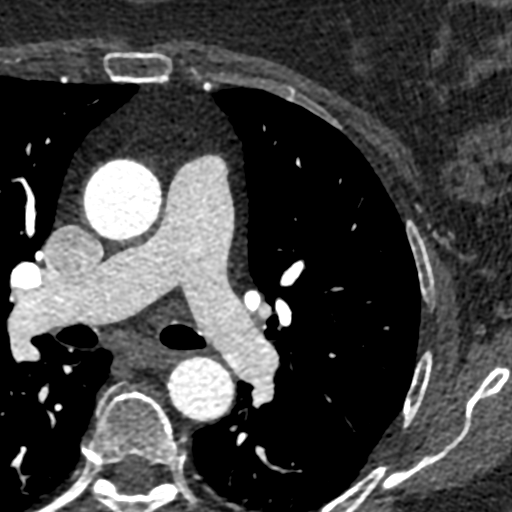
[im 3710/4452  vessel]
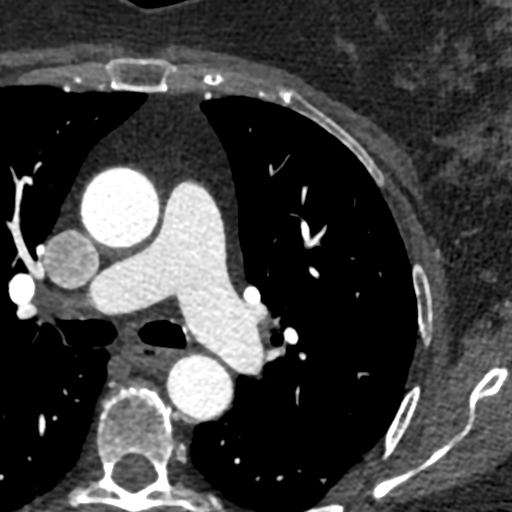
[im 4204/4452  vessel]
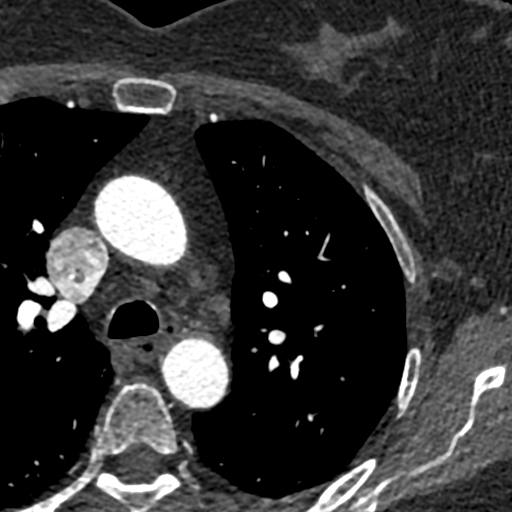

[12 of 20 positions shown; findings below may reference images not displayed]

FINDINGS: Aortic atherosclerosis. Tiny calcified granuloma in the periphery of
the right upper lobe. Within the visualized portions of the thorax
there are no other suspicious appearing pulmonary nodules or masses,
there is no acute consolidative airspace disease, no pleural
effusions, no pneumothorax and no lymphadenopathy. Visualized
portions of the upper abdomen are unremarkable. There are no
aggressive appearing lytic or blastic lesions noted in the
visualized portions of the skeleton.
IMPRESSION: 1.  Aortic Atherosclerosis (333AY-12E.E).
FINDINGS: Image quality: Average.

Noise artifact is: Moderate cardiac motion artifact due to
arrhythmia.

Coronary Arteries:  Normal coronary origin.  Right dominance.

Left main: The left main is a large caliber vessel with a normal
take off from the left coronary cusp that bifurcates to form a left
anterior descending artery and a left circumflex artery. There is
minimal calcified plaque (<25%).

Left anterior descending artery: The proximal LAD contains mild
calcified plaque (25-49%). The mid LAD contains mild mixed density
plaque (25-49%). The first diagonal has mild calcified plaque
(25-49%).

Left circumflex artery: The LCX is non-dominant. The mid to distal
LCX has mild calcified plaque (25-49%). The LCX gives off 2 patent
obtuse marginal branches.

Right coronary artery: The RCA is dominant with normal take off from
the right coronary cusp. There is no evidence of plaque or stenosis.
The RCA terminates as a PDA and right posterolateral branch without
evidence of plaque or stenosis.

Right Atrium: Right atrial size is within normal limits.

Right Ventricle: The right ventricular cavity is within normal
limits.

Left Atrium: Left atrial size is normal in size with no left atrial
appendage filling defect.

Left Ventricle: The ventricular cavity size is within normal limits.
There are no stigmata of prior infarction. There is no abnormal
filling defect.

Pulmonary arteries: Normal in size without proximal filling defect.

Pulmonary veins: Normal pulmonary venous drainage.

Pericardium: Normal thickness with no significant effusion or
calcium present.

Cardiac valves: The aortic valve is trileaflet without significant
calcification. The mitral valve is normal structure without
significant calcification.

Aorta: Normal caliber with no significant disease.

Extra-cardiac findings: See attached radiology report for
non-cardiac structures.
IMPRESSION: 1. Coronary calcium score of 388. This was 84th percentile for age
and sex matched controls.

2. Normal coronary origin with right dominance.

3. Significant cardiac motion artifact due to arrhythmia. However,
the study was interpretable and demonstrates mild, calcified plaque
(25-49%) in the LAD/LCX that is non-obstructive.

RECOMMENDATIONS:
1. Mild non-obstructive CAD (25-49%). Consider non-atherosclerotic
causes of chest pain. Consider preventive therapy and risk factor
modification.

*** End of Addendum ***
EXAM:
OVER-READ INTERPRETATION  CT CHEST

The following report is an over-read performed by radiologist Dr.
Adenyo Ologo [REDACTED] on 02/08/2020. This
over-read does not include interpretation of cardiac or coronary
anatomy or pathology. The coronary calcium score/coronary CTA
interpretation by the cardiologist is attached.
FINDINGS: Aortic atherosclerosis. Tiny calcified granuloma in the periphery of
the right upper lobe. Within the visualized portions of the thorax
there are no other suspicious appearing pulmonary nodules or masses,
there is no acute consolidative airspace disease, no pleural
effusions, no pneumothorax and no lymphadenopathy. Visualized
portions of the upper abdomen are unremarkable. There are no
aggressive appearing lytic or blastic lesions noted in the
visualized portions of the skeleton.
IMPRESSION: 1.  Aortic Atherosclerosis (333AY-12E.E).

## 2020-02-08 MED ORDER — NITROGLYCERIN 0.4 MG SL SUBL
0.8000 mg | SUBLINGUAL_TABLET | Freq: Once | SUBLINGUAL | Status: AC
  Administered 2020-02-08: 0.8 mg via SUBLINGUAL

## 2020-02-08 MED ORDER — NITROGLYCERIN 0.4 MG SL SUBL
SUBLINGUAL_TABLET | SUBLINGUAL | Status: AC
  Filled 2020-02-08: qty 2

## 2020-02-08 MED ORDER — IOHEXOL 350 MG/ML SOLN
80.0000 mL | Freq: Once | INTRAVENOUS | Status: AC | PRN
  Administered 2020-02-08: 80 mL via INTRAVENOUS

## 2020-02-08 NOTE — Progress Notes (Signed)
Cardiology Office Note:   Date:  02/09/2020  NAME:  Cindy Bautista    MRN: [id number] DOB:  [birth year]   PCP:  Kathyrn Lass, MD  Cardiologist:  No primary care provider on file.   Referring MD: Kathyrn Lass, MD   Chief Complaint  Patient presents with  . Follow-up   History of Present Illness:   Cindy Bautista is a 74 y.o. female with a hx of DM, HTN, HLD, CAD who presents for follow-up of chest pain. CCTA with mild non-obstructive CAD yesterday. She had runs of atrial tachycardia during the scan. Echo normal. She reports she is not had any further episodes of palpitations.  She is never had that before.  I did discuss with her possibly she was having competition from ectopic pacemaker cells in the setting of having a lower heart rate and expected.  She reports her symptoms resolved around noon yesterday.  She reports that she still is getting short of breath with heavy exertion such as activities that include climbing hills but she is able to walk on flat surfaces without any limitations.  She denies any chest pain or chest pressure.  We discussed that her LDL cholesterol needs to be a bit lower.  I discussed switching to Crestor.  She is okay to do this.  She was taking a baby aspirin but is stopped doing this.  I advised her to restart this.  Her most recent A1c was 8.4.  She has been recently started on Jardiance.  This is a good medication for her.  She is exercising 2 miles 3 to 4 days/week.  This includes walking.  She denies any chest pain or shortness of breath with that.  Apparently when she hits inclines or hills less than she develops shortness of breath.  Problem List 1. HTN 2. DM -A1c 8.0 3. HLD -Total cholesterol 158, triglycerides 212, LDL 72, HDL 43 4. Non-obstructive CAD -CAC score 388 (84th percentile) -25-49% LAD/RCA 5. Atrial tachycardia    Past Medical History: Past Medical History:  Diagnosis Date  . Coronary artery disease   . Diabetes mellitus without  complication ([place])   . Hyperlipidemia   . Hypertension     Past Surgical History: Past Surgical History:  Procedure Laterality Date  . CESAREAN SECTION      Current Medications: Current Meds  Medication Sig  . Calcium Carbonate-Vit D-Min (CALCIUM 600+D PLUS MINERALS) 600-400 MG-UNIT TABS Take by mouth.  . empagliflozin (JARDIANCE) 10 MG TABS tablet Take 25 mg by mouth daily.  Marland Kitchen glimepiride (AMARYL) 4 MG tablet Take 4 mg by mouth daily.  . Loratadine (CLARITIN PO) Take 1 tablet by mouth.  . losartan-hydrochlorothiazide (HYZAAR) 50-12.5 MG tablet Take 1 tablet by mouth daily.  . meloxicam (MOBIC) 7.5 MG tablet Take 7.5 mg by mouth daily.  . metFORMIN (GLUCOPHAGE-XR) 500 MG 24 hr tablet Take by mouth.  . metoprolol tartrate (LOPRESSOR) 100 MG tablet Take 1 tablet by mouth once for procedure.  Marland Kitchen omeprazole (PRILOSEC) 20 MG capsule 1 CAPSULE TAKES WHEN SHE TAKES THE MELOXICAM ORALLY  . [DISCONTINUED] simvastatin (ZOCOR) 20 MG tablet Take 20 mg by mouth at bedtime.     Allergies:    Ace inhibitors   Social History: Social History   Socioeconomic History  . Marital status: Divorced    Spouse name: Not on file  . Number of children: Not on file  . Years of education: Not on file  . Highest education level: Not on file  Occupational History  . Occupation: physical therapist  Tobacco Use  . Smoking status: Never Smoker  . Smokeless tobacco: Never Used  Substance and Sexual Activity  . Alcohol use: Not Currently  . Drug use: Never  . Sexual activity: Not on file  Other Topics Concern  . Not on file  Social History Narrative  . Not on file   Social Determinants of Health   Financial Resource Strain:   . Difficulty of Paying Living Expenses:   Food Insecurity:   . Worried About Charity fundraiser in the Last Year:   . Arboriculturist in the Last Year:   Transportation Needs:   . Film/video editor (Medical):   Marland Kitchen Lack of Transportation (Non-Medical):   Physical  Activity:   . Days of Exercise per Week:   . Minutes of Exercise per Session:   Stress:   . Feeling of Stress :   Social Connections:   . Frequency of Communication with Friends and Family:   . Frequency of Social Gatherings with Friends and Family:   . Attends Religious Services:   . Active Member of Clubs or Organizations:   . Attends Archivist Meetings:   Marland Kitchen Marital Status:      Family History: The patient's family history includes Diabetes in her father; Heart attack in her son; Heart disease in her mother; Heart disease (age of onset: 6) in her father; Heart failure in her mother; Hyperlipidemia in her father.  ROS:   All other ROS reviewed and negative. Pertinent positives noted in the HPI.     EKGs/Labs/Other Studies Reviewed:   The following studies were personally reviewed by me today:  EKG:  EKG is ordered today.  The ekg ordered today demonstrates normal sinus rhythm, heart rate 63, no acute ST-T changes, no evidence of prior infarction, and was personally reviewed by me.   TTE 01/14/2020 1. Left ventricular ejection fraction, by estimation, is 70 to 75%. The  left ventricle has hyperdynamic function. The left ventricle has no  regional wall motion abnormalities. There is moderate asymmetric left  ventricular hypertrophy of the basal-septal  segment. Left ventricular diastolic parameters are consistent with Grade  I diastolic dysfunction (impaired relaxation). Elevated left atrial  pressure.  2. Right ventricular systolic function is normal. The right ventricular  size is normal.  3. The mitral valve is normal in structure. No evidence of mitral valve  regurgitation. No evidence of mitral stenosis. Severe mitral annular  calcification.  4. The aortic valve is tricuspid. Aortic valve regurgitation is not  visualized. Mild aortic valve sclerosis is present, with no evidence of  aortic valve stenosis.  5. The inferior vena cava is normal in size with  greater than 50%  respiratory variability, suggesting right atrial pressure of 3 mmHg.   CCTA 02/08/2020 IMPRESSION: 1. Coronary calcium score of 388. This was 84th percentile for age and sex matched controls.  2. Normal coronary origin with right dominance.  3. Significant cardiac motion artifact due to arrhythmia. However, the study was interpretable and demonstrates mild, calcified plaque (25-49%) in the LAD/LCX that is non-obstructive.  RECOMMENDATIONS: 1. Mild non-obstructive CAD (25-49%). Consider non-atherosclerotic causes of chest pain. Consider preventive therapy and risk factor modification.   Recent Labs: 02/03/2020: BUN 18; Creatinine, Ser 0.74; Potassium 5.0; Sodium 143   Recent Lipid Panel No results found for: CHOL, TRIG, HDL, CHOLHDL, VLDL, LDLCALC, LDLDIRECT  Physical Exam:   VS:  BP 130/76 (BP Location: Left Arm,  Patient Position: Sitting, Cuff Size: Large)   Pulse 63   Temp (!) 97.1 F (36.2 C)   Ht 5\' 3"  (1.6 m)   Wt 205 lb (93 kg)   SpO2 96%   BMI 36.31 kg/m    Wt Readings from Last 3 Encounters:  02/09/20 205 lb (93 kg)  12/24/19 214 lb 9.6 oz (97.3 kg)    General: Well nourished, well developed, in no acute distress Heart: Atraumatic, normal size  Eyes: PEERLA, EOMI  Neck: Supple, no JVD Endocrine: No thryomegaly Cardiac: Normal S1, S2; RRR; no murmurs, rubs, or gallops Lungs: Clear to auscultation bilaterally, no wheezing, rhonchi or rales  Abd: Soft, nontender, no hepatomegaly  Ext: No edema, pulses 2+ Musculoskeletal: No deformities, BUE and BLE strength normal and equal Skin: Warm and dry, no rashes   Neuro: Alert and oriented to person, place, time, and situation, CNII-XII grossly intact, no focal deficits  Psych: Normal mood and affect   ASSESSMENT:   Cindy Bautista is a 74 y.o. female who presents for the following: 1. Atrial tachycardia (HCC)   2. Chest pain, unspecified type   3. SOB (shortness of breath) on exertion   4.  Coronary artery disease involving native coronary artery of native heart without angina pectoris   5. Essential hypertension   6. Mixed hyperlipidemia     PLAN:   1. Atrial tachycardia ([place]) -She had an episode of atrial tachycardia yesterday.  They were brief episodes when she took her metoprolol for coronary CTA.  Her symptoms resolved yesterday around noon.  Her EKG today shows no evidence of recurrence.  She has no further symptoms.  Possibly she had competition from pacemaker cells in the atria when her heart rate was low on metoprolol.  I see no need for further evaluation given no symptoms.  We know she has nonobstructive CAD and normal echocardiogram.  Should her symptoms recur she will reach back out to [place].  2. Chest pain, unspecified type 3. SOB (shortness of breath) on exertion -Nonobstructive CAD on coronary CTA.  Echocardiogram unremarkable.  I suspect some of her symptoms are deconditioning related.  She is safe to continue exercising.  4. Coronary artery disease involving native coronary artery of native heart without angina pectoris -Nonobstructive CAD on coronary CTA.  Calcium score elevated.  We will switch her to Crestor 20 mg daily.  Goal LDL cholesterol is less than 70.  Her triglycerides were elevated 212 in October.  We do need to get these lower.  We will plan to see her back in August and have a repeat lipid profile while on Crestor.  She will stop simvastatin.  I have also advised her to take aspirin 81 mg daily  5. Essential hypertension -Acceptable today.  6. Mixed hyperlipidemia -Switch to Crestor as above.  Disposition: Return keep August appointment.  Medication Adjustments/Labs and Tests Ordered: Current medicines are reviewed at length with the patient today.  Concerns regarding medicines are outlined above.  Orders Placed This Encounter  Procedures  . Lipid panel  . EKG 12-Lead   Meds ordered this encounter  Medications  . rosuvastatin (CRESTOR) 20  MG tablet    Sig: Take 1 tablet (20 mg total) by mouth daily.    Dispense:  90 tablet    Refill:  3  . aspirin EC 81 MG tablet    Sig: Take 1 tablet (81 mg total) by mouth daily. Swallow whole.    Dispense:  90 tablet  Refill:  3    Patient Instructions  Medication Instructions:  Stop Simvastatin  Start Crestor 20 mg daily  Start Aspirin 81 mg daily  *If you need a refill on your cardiac medications before your next appointment, please call your pharmacy*   Lab Work: LIPID (1 week before follow up in August, come fasting, no appointment for lab needed)  If you have labs (blood work) drawn today and your tests are completely normal, you will receive your results only by: Marland Kitchen MyChart Message (if you have MyChart) OR . A paper copy in the mail If you have any lab test that is abnormal or we need to change your treatment, we will call you to review the results.  Follow-Up: At [facility], you and your health needs are our priority.  As part of our continuing mission to provide you with exceptional heart care, we have created designated Provider Care Teams.  These Care Teams include your primary Cardiologist (physician) and Advanced Practice Providers (APPs -  Physician Assistants and Nurse Practitioners) who all work together to provide you with the care you need, when you need it.  We recommend signing up for the patient portal called "MyChart".  Sign up information is provided on this After Visit Summary.  MyChart is used to connect with patients for Virtual Visits (Telemedicine).  Patients are able to view lab/test results, encounter notes, upcoming appointments, etc.  Non-urgent messages can be sent to your provider as well.   To learn more about what you can do with MyChart, go to [email or web address].    Your next appointment:   August 11th, 2021 @ 8:00 AM        Time Spent with Patient: I have spent a total of 35 minutes with patient reviewing hospital notes,  telemetry, EKGs, labs and examining the patient as well as establishing an assessment and plan that was discussed with the patient.  > 50% of time was spent in direct patient care.  Signed, Addison Naegeli. Audie Box, [place]  [address] [place], [place] [address] [phone]  02/09/2020 11:36 AM

## 2020-02-09 ENCOUNTER — Encounter: Payer: Self-pay | Admitting: Cardiovascular Disease

## 2020-02-09 ENCOUNTER — Ambulatory Visit: Payer: Medicare HMO | Admitting: Cardiovascular Disease

## 2020-02-09 VITALS — BP 130/76 | HR 63 | Temp 97.1°F | Ht 63.0 in | Wt 205.0 lb

## 2020-02-09 DIAGNOSIS — I10 Essential (primary) hypertension: Secondary | ICD-10-CM

## 2020-02-09 DIAGNOSIS — I2510 Atherosclerotic heart disease of native coronary artery without angina pectoris: Secondary | ICD-10-CM | POA: Diagnosis not present

## 2020-02-09 DIAGNOSIS — R079 Chest pain, unspecified: Secondary | ICD-10-CM

## 2020-02-09 DIAGNOSIS — E782 Mixed hyperlipidemia: Secondary | ICD-10-CM | POA: Diagnosis not present

## 2020-02-09 DIAGNOSIS — R0602 Shortness of breath: Secondary | ICD-10-CM | POA: Diagnosis not present

## 2020-02-09 DIAGNOSIS — I471 Supraventricular tachycardia: Secondary | ICD-10-CM

## 2020-02-09 MED ORDER — ASPIRIN EC 81 MG PO TBEC
81.0000 mg | DELAYED_RELEASE_TABLET | Freq: Every day | ORAL | 3 refills | Status: AC
Start: 2020-02-09 — End: ?

## 2020-02-09 MED ORDER — ROSUVASTATIN CALCIUM 20 MG PO TABS
20.0000 mg | ORAL_TABLET | Freq: Every day | ORAL | 3 refills | Status: DC
Start: 2020-02-09 — End: 2020-11-20

## 2020-02-09 NOTE — Patient Instructions (Addendum)
Medication Instructions:  Stop Simvastatin  Start Crestor 20 mg daily  Start Aspirin 81 mg daily  *If you need a refill on your cardiac medications before your next appointment, please call your pharmacy*   Lab Work: LIPID (1 week before follow up in August, come fasting, no appointment for lab needed)  If you have labs (blood work) drawn today and your tests are completely normal, you will receive your results only by: Marland Kitchen MyChart Message (if you have MyChart) OR . A paper copy in the mail If you have any lab test that is abnormal or we need to change your treatment, we will call you to review the results.  Follow-Up: At [facility], you and your health needs are our priority.  As part of our continuing mission to provide you with exceptional heart care, we have created designated Provider Care Teams.  These Care Teams include your primary Cardiologist (physician) and Advanced Practice Providers (APPs -  Physician Assistants and Nurse Practitioners) who all work together to provide you with the care you need, when you need it.  We recommend signing up for the patient portal called "MyChart".  Sign up information is provided on this After Visit Summary.  MyChart is used to connect with patients for Virtual Visits (Telemedicine).  Patients are able to view lab/test results, encounter notes, upcoming appointments, etc.  Non-urgent messages can be sent to your provider as well.   To learn more about what you can do with MyChart, go to [email or web address].    Your next appointment:   August 11th, 2021 @ 8:00 AM

## 2020-02-29 DIAGNOSIS — R69 Illness, unspecified: Secondary | ICD-10-CM | POA: Diagnosis not present

## 2020-03-02 DIAGNOSIS — E7800 Pure hypercholesterolemia, unspecified: Secondary | ICD-10-CM | POA: Diagnosis not present

## 2020-03-02 DIAGNOSIS — E1169 Type 2 diabetes mellitus with other specified complication: Secondary | ICD-10-CM | POA: Diagnosis not present

## 2020-03-02 DIAGNOSIS — I10 Essential (primary) hypertension: Secondary | ICD-10-CM | POA: Diagnosis not present

## 2020-03-03 DIAGNOSIS — R69 Illness, unspecified: Secondary | ICD-10-CM | POA: Diagnosis not present

## 2020-03-29 ENCOUNTER — Ambulatory Visit: Payer: Medicare HMO | Admitting: Cardiovascular Disease

## 2020-04-03 DIAGNOSIS — M545 Low back pain: Secondary | ICD-10-CM | POA: Diagnosis not present

## 2020-04-03 DIAGNOSIS — M8580 Other specified disorders of bone density and structure, unspecified site: Secondary | ICD-10-CM | POA: Diagnosis not present

## 2020-04-03 DIAGNOSIS — I10 Essential (primary) hypertension: Secondary | ICD-10-CM | POA: Diagnosis not present

## 2020-04-03 DIAGNOSIS — K219 Gastro-esophageal reflux disease without esophagitis: Secondary | ICD-10-CM | POA: Diagnosis not present

## 2020-04-03 DIAGNOSIS — E7800 Pure hypercholesterolemia, unspecified: Secondary | ICD-10-CM | POA: Diagnosis not present

## 2020-04-03 DIAGNOSIS — E1169 Type 2 diabetes mellitus with other specified complication: Secondary | ICD-10-CM | POA: Diagnosis not present

## 2020-04-03 DIAGNOSIS — E538 Deficiency of other specified B group vitamins: Secondary | ICD-10-CM | POA: Diagnosis not present

## 2020-04-04 DIAGNOSIS — E7800 Pure hypercholesterolemia, unspecified: Secondary | ICD-10-CM | POA: Diagnosis not present

## 2020-04-04 DIAGNOSIS — K219 Gastro-esophageal reflux disease without esophagitis: Secondary | ICD-10-CM | POA: Diagnosis not present

## 2020-04-04 DIAGNOSIS — I10 Essential (primary) hypertension: Secondary | ICD-10-CM | POA: Diagnosis not present

## 2020-04-04 DIAGNOSIS — E1169 Type 2 diabetes mellitus with other specified complication: Secondary | ICD-10-CM | POA: Diagnosis not present

## 2020-04-09 NOTE — Progress Notes (Signed)
Cardiology Office Note:   Date:  04/11/2020  NAME:  Cindy Bautista    MRN: [id number] DOB:  12/11/1945   PCP:  Kathyrn Lass, MD  Cardiologist:  Evalina Field, MD   Referring MD: Kathyrn Lass, MD   Chief Complaint  Patient presents with  . Follow-up    History of Present Illness:   Cindy Bautista is a 74 y.o. female with a hx of HTN, obesity, DM, CAD who presents for follow-up. Diagnosed with atrial tachycardia at time of CCTA.  Her most recent lipid profile shows she is at goal.  She is tolerating Crestor well.  Her most recent LDL was 67.  She reports she is having infrequent episodes of palpitations.  They can occur maybe 1 time per week.  She reports they happen when she exerting herself in a hot environment.  She reports that when she takes a break the symptoms resolved.  She is able to continue walking without further palpitations.  She reports she is exercising 4 to 5 days/week.  Walking up to 2 miles.  8 to 10 miles per week.  Seems to have no chest pain or shortness of breath with this level of activity.  Her most recent A1c is come down to 6.9.  She is doing remarkably well.  She denies symptoms of chest pain, shortness of breath or palpitations today.  Problem List 1. HTN 2. DM -A1c 6.9 3. HLD -Total cholesterol 136, HDL 47, LDL 67, triglycerides 123 4. Non-obstructive CAD -CAC score 388 (84th percentile) -25-49% LAD/RCA 5. Atrial tachycardia   Past Medical History: Past Medical History:  Diagnosis Date  . Coronary artery disease   . Diabetes mellitus without complication ([place])   . Hyperlipidemia   . Hypertension     Past Surgical History: Past Surgical History:  Procedure Laterality Date  . CESAREAN SECTION      Current Medications: Current Meds  Medication Sig  . aspirin EC 81 MG tablet Take 1 tablet (81 mg total) by mouth daily. Swallow whole.  . Calcium Carbonate-Vit D-Min (CALCIUM 600+D PLUS MINERALS) 600-400 MG-UNIT TABS Take by mouth.  .  empagliflozin (JARDIANCE) 10 MG TABS tablet Take 25 mg by mouth daily.  Marland Kitchen glimepiride (AMARYL) 4 MG tablet Take 4 mg by mouth daily.  . Loratadine (CLARITIN PO) Take 1 tablet by mouth.  . losartan-hydrochlorothiazide (HYZAAR) 50-12.5 MG tablet Take 1 tablet by mouth daily.  . meloxicam (MOBIC) 7.5 MG tablet Take 7.5 mg by mouth daily.  . metFORMIN (GLUCOPHAGE-XR) 500 MG 24 hr tablet Take by mouth.  . metoprolol tartrate (LOPRESSOR) 100 MG tablet Take 1 tablet by mouth once for procedure.  Marland Kitchen omeprazole (PRILOSEC) 20 MG capsule 1 CAPSULE TAKES WHEN SHE TAKES THE MELOXICAM ORALLY  . rosuvastatin (CRESTOR) 20 MG tablet Take 1 tablet (20 mg total) by mouth daily.     Allergies:    Ace inhibitors   Social History: Social History   Socioeconomic History  . Marital status: Divorced    Spouse name: Not on file  . Number of children: Not on file  . Years of education: Not on file  . Highest education level: Not on file  Occupational History  . Occupation: physical therapist  Tobacco Use  . Smoking status: Never Smoker  . Smokeless tobacco: Never Used  Substance and Sexual Activity  . Alcohol use: Not Currently  . Drug use: Never  . Sexual activity: Not on file  Other Topics Concern  . Not on  file  Social History Narrative  . Not on file   Social Determinants of Health   Financial Resource Strain:   . Difficulty of Paying Living Expenses: Not on file  Food Insecurity:   . Worried About Charity fundraiser in the Last Year: Not on file  . Ran Out of Food in the Last Year: Not on file  Transportation Needs:   . Lack of Transportation (Medical): Not on file  . Lack of Transportation (Non-Medical): Not on file  Physical Activity:   . Days of Exercise per Week: Not on file  . Minutes of Exercise per Session: Not on file  Stress:   . Feeling of Stress : Not on file  Social Connections:   . Frequency of Communication with Friends and Family: Not on file  . Frequency of Social  Gatherings with Friends and Family: Not on file  . Attends Religious Services: Not on file  . Active Member of Clubs or Organizations: Not on file  . Attends Archivist Meetings: Not on file  . Marital Status: Not on file     Family History: The patient's family history includes Diabetes in her father; Heart attack in her son; Heart disease in her mother; Heart disease (age of onset: 41) in her father; Heart failure in her mother; Hyperlipidemia in her father.  ROS:   All other ROS reviewed and negative. Pertinent positives noted in the HPI.     EKGs/Labs/Other Studies Reviewed:   The following studies were personally reviewed by me today:  CCTA 02/08/2020 1. Coronary calcium score of 388. This was 84th percentile for age and sex matched controls.  2. Normal coronary origin with right dominance.  3. Significant cardiac motion artifact due to arrhythmia. However, the study was interpretable and demonstrates mild, calcified plaque (25-49%) in the LAD/LCX that is non-obstructive.  TTE 01/14/2020 1. Left ventricular ejection fraction, by estimation, is 70 to 75%. The  left ventricle has hyperdynamic function. The left ventricle has no  regional wall motion abnormalities. There is moderate asymmetric left  ventricular hypertrophy of the basal-septal  segment. Left ventricular diastolic parameters are consistent with Grade  I diastolic dysfunction (impaired relaxation). Elevated left atrial  pressure.  2. Right ventricular systolic function is normal. The right ventricular  size is normal.  3. The mitral valve is normal in structure. No evidence of mitral valve  regurgitation. No evidence of mitral stenosis. Severe mitral annular  calcification.  4. The aortic valve is tricuspid. Aortic valve regurgitation is not  visualized. Mild aortic valve sclerosis is present, with no evidence of  aortic valve stenosis.  5. The inferior vena cava is normal in size with greater  than 50%  respiratory variability, suggesting right atrial pressure of 3 mmHg.   Recent Labs: 02/03/2020: BUN 18; Creatinine, Ser 0.74; Potassium 5.0; Sodium 143   Recent Lipid Panel No results found for: CHOL, TRIG, HDL, CHOLHDL, VLDL, LDLCALC, LDLDIRECT  Physical Exam:   VS:  BP (!) 146/68   Pulse 74   Ht 5\' 3"  (1.6 m)   Wt 199 lb (90.3 kg)   BMI 35.25 kg/m    Wt Readings from Last 3 Encounters:  04/11/20 199 lb (90.3 kg)  02/09/20 205 lb (93 kg)  12/24/19 214 lb 9.6 oz (97.3 kg)    General: Well nourished, well developed, in no acute distress Heart: Atraumatic, normal size  Eyes: PEERLA, EOMI  Neck: Supple, no JVD Endocrine: No thryomegaly Cardiac: Normal S1, S2; RRR;  no murmurs, rubs, or gallops Lungs: Clear to auscultation bilaterally, no wheezing, rhonchi or rales  Abd: Soft, nontender, no hepatomegaly  Ext: No edema, pulses 2+ Musculoskeletal: No deformities, BUE and BLE strength normal and equal Skin: Warm and dry, no rashes   Neuro: Alert and oriented to person, place, time, and situation, CNII-XII grossly intact, no focal deficits  Psych: Normal mood and affect   ASSESSMENT:   WALBURGA HUDMAN is a 74 y.o. female who presents for the following: 1. Atrial tachycardia ([place])   2. Coronary artery disease involving native coronary artery of native heart without angina pectoris   3. Mixed hyperlipidemia   4. Essential hypertension     PLAN:   1. Atrial tachycardia ([facility]) -Infrequent episodes.  She does not want to start medications unless necessary.  Symptoms occur once per week and are manageable without medication.  We will continue to monitor for now.  2. Coronary artery disease involving native coronary artery of native heart without angina pectoris -Nonobstructive CAD on recent cardiac CTA.  Continue aspirin 81 mg daily.  On Jardiance.  She is also on Crestor.  Most recent LDL cholesterol 67 which is at goal.  3. Mixed hyperlipidemia -Continue Crestor.  LDL  67.  4. Essential hypertension -BP acceptable today.  No change in medication.  Disposition: Return in about 1 year (around 04/11/2021).  Medication Adjustments/Labs and Tests Ordered: Current medicines are reviewed at length with the patient today.  Concerns regarding medicines are outlined above.  No orders of the defined types were placed in this encounter.  No orders of the defined types were placed in this encounter.   Patient Instructions  Medication Instructions:  The current medical regimen is effective;  continue present plan and medications.  *If you need a refill on your cardiac medications before your next appointment, please call your pharmacy*  Follow-Up: At [facility], you and your health needs are our priority.  As part of our continuing mission to provide you with exceptional heart care, we have created designated Provider Care Teams.  These Care Teams include your primary Cardiologist (physician) and Advanced Practice Providers (APPs -  Physician Assistants and Nurse Practitioners) who all work together to provide you with the care you need, when you need it.  We recommend signing up for the patient portal called "MyChart".  Sign up information is provided on this After Visit Summary.  MyChart is used to connect with patients for Virtual Visits (Telemedicine).  Patients are able to view lab/test results, encounter notes, upcoming appointments, etc.  Non-urgent messages can be sent to your provider as well.   To learn more about what you can do with MyChart, go to [email or web address].    Your next appointment:   12 month(s)  The format for your next appointment:   In Person  Provider:   Eleonore Chiquito, MD       Time Spent with Patient: I have spent a total of 25 minutes with patient reviewing hospital notes, telemetry, EKGs, labs and examining the patient as well as establishing an assessment and plan that was discussed with the patient.  > 50% of time  was spent in direct patient care.  Signed, Addison Naegeli. Audie Box, [place]  [address], [place],  [address] [phone]  04/11/2020 9:42 AM

## 2020-04-10 DIAGNOSIS — R69 Illness, unspecified: Secondary | ICD-10-CM | POA: Diagnosis not present

## 2020-04-11 ENCOUNTER — Other Ambulatory Visit: Payer: Self-pay

## 2020-04-11 ENCOUNTER — Encounter: Payer: Self-pay | Admitting: Cardiovascular Disease

## 2020-04-11 ENCOUNTER — Ambulatory Visit: Payer: Medicare HMO | Admitting: Cardiovascular Disease

## 2020-04-11 VITALS — BP 146/68 | HR 74 | Ht 63.0 in | Wt 199.0 lb

## 2020-04-11 DIAGNOSIS — I471 Supraventricular tachycardia: Secondary | ICD-10-CM | POA: Diagnosis not present

## 2020-04-11 DIAGNOSIS — I10 Essential (primary) hypertension: Secondary | ICD-10-CM

## 2020-04-11 DIAGNOSIS — E782 Mixed hyperlipidemia: Secondary | ICD-10-CM

## 2020-04-11 DIAGNOSIS — I2510 Atherosclerotic heart disease of native coronary artery without angina pectoris: Secondary | ICD-10-CM | POA: Diagnosis not present

## 2020-04-11 NOTE — Patient Instructions (Signed)
Medication Instructions:  The current medical regimen is effective;  continue present plan and medications.  *If you need a refill on your cardiac medications before your next appointment, please call your pharmacy*   Follow-Up: At [facility], you and your health needs are our priority.  As part of our continuing mission to provide you with exceptional heart care, we have created designated Provider Care Teams.  These Care Teams include your primary Cardiologist (physician) and Advanced Practice Providers (APPs -  Physician Assistants and Nurse Practitioners) who all work together to provide you with the care you need, when you need it.  We recommend signing up for the patient portal called "MyChart".  Sign up information is provided on this After Visit Summary.  MyChart is used to connect with patients for Virtual Visits (Telemedicine).  Patients are able to view lab/test results, encounter notes, upcoming appointments, etc.  Non-urgent messages can be sent to your provider as well.   To learn more about what you can do with MyChart, go to [email or web address].    Your next appointment:   12 month(s)  The format for your next appointment:   In Person  Provider:   [place] O'[place]al, MD     

## 2020-05-03 DIAGNOSIS — Z20822 Contact with and (suspected) exposure to covid-19: Secondary | ICD-10-CM | POA: Diagnosis not present

## 2020-05-05 DIAGNOSIS — E1169 Type 2 diabetes mellitus with other specified complication: Secondary | ICD-10-CM | POA: Diagnosis not present

## 2020-05-05 DIAGNOSIS — I10 Essential (primary) hypertension: Secondary | ICD-10-CM | POA: Diagnosis not present

## 2020-05-05 DIAGNOSIS — K219 Gastro-esophageal reflux disease without esophagitis: Secondary | ICD-10-CM | POA: Diagnosis not present

## 2020-05-05 DIAGNOSIS — E7800 Pure hypercholesterolemia, unspecified: Secondary | ICD-10-CM | POA: Diagnosis not present

## 2020-05-12 DIAGNOSIS — Z1231 Encounter for screening mammogram for malignant neoplasm of breast: Secondary | ICD-10-CM | POA: Diagnosis not present

## 2020-05-29 DIAGNOSIS — K219 Gastro-esophageal reflux disease without esophagitis: Secondary | ICD-10-CM | POA: Diagnosis not present

## 2020-05-29 DIAGNOSIS — E1169 Type 2 diabetes mellitus with other specified complication: Secondary | ICD-10-CM | POA: Diagnosis not present

## 2020-05-29 DIAGNOSIS — I10 Essential (primary) hypertension: Secondary | ICD-10-CM | POA: Diagnosis not present

## 2020-05-29 DIAGNOSIS — E7800 Pure hypercholesterolemia, unspecified: Secondary | ICD-10-CM | POA: Diagnosis not present

## 2020-06-06 DIAGNOSIS — M8580 Other specified disorders of bone density and structure, unspecified site: Secondary | ICD-10-CM | POA: Diagnosis not present

## 2020-06-06 DIAGNOSIS — Z0000 Encounter for general adult medical examination without abnormal findings: Secondary | ICD-10-CM | POA: Diagnosis not present

## 2020-06-06 DIAGNOSIS — E1169 Type 2 diabetes mellitus with other specified complication: Secondary | ICD-10-CM | POA: Diagnosis not present

## 2020-06-06 DIAGNOSIS — M545 Low back pain, unspecified: Secondary | ICD-10-CM | POA: Diagnosis not present

## 2020-06-06 DIAGNOSIS — E7800 Pure hypercholesterolemia, unspecified: Secondary | ICD-10-CM | POA: Diagnosis not present

## 2020-06-06 DIAGNOSIS — K219 Gastro-esophageal reflux disease without esophagitis: Secondary | ICD-10-CM | POA: Diagnosis not present

## 2020-06-06 DIAGNOSIS — Z23 Encounter for immunization: Secondary | ICD-10-CM | POA: Diagnosis not present

## 2020-06-06 DIAGNOSIS — I10 Essential (primary) hypertension: Secondary | ICD-10-CM | POA: Diagnosis not present

## 2020-06-06 DIAGNOSIS — Z6836 Body mass index (BMI) 36.0-36.9, adult: Secondary | ICD-10-CM | POA: Diagnosis not present

## 2020-07-03 DIAGNOSIS — E119 Type 2 diabetes mellitus without complications: Secondary | ICD-10-CM | POA: Diagnosis not present

## 2020-07-03 DIAGNOSIS — H31092 Other chorioretinal scars, left eye: Secondary | ICD-10-CM | POA: Diagnosis not present

## 2020-07-03 DIAGNOSIS — H35033 Hypertensive retinopathy, bilateral: Secondary | ICD-10-CM | POA: Diagnosis not present

## 2020-07-03 DIAGNOSIS — Z961 Presence of intraocular lens: Secondary | ICD-10-CM | POA: Diagnosis not present

## 2020-07-05 DIAGNOSIS — M8580 Other specified disorders of bone density and structure, unspecified site: Secondary | ICD-10-CM | POA: Diagnosis not present

## 2020-07-05 DIAGNOSIS — E7800 Pure hypercholesterolemia, unspecified: Secondary | ICD-10-CM | POA: Diagnosis not present

## 2020-07-05 DIAGNOSIS — I10 Essential (primary) hypertension: Secondary | ICD-10-CM | POA: Diagnosis not present

## 2020-07-05 DIAGNOSIS — E1169 Type 2 diabetes mellitus with other specified complication: Secondary | ICD-10-CM | POA: Diagnosis not present

## 2020-07-05 DIAGNOSIS — K219 Gastro-esophageal reflux disease without esophagitis: Secondary | ICD-10-CM | POA: Diagnosis not present

## 2020-07-26 DIAGNOSIS — I10 Essential (primary) hypertension: Secondary | ICD-10-CM | POA: Diagnosis not present

## 2020-07-26 DIAGNOSIS — E7800 Pure hypercholesterolemia, unspecified: Secondary | ICD-10-CM | POA: Diagnosis not present

## 2020-07-26 DIAGNOSIS — K219 Gastro-esophageal reflux disease without esophagitis: Secondary | ICD-10-CM | POA: Diagnosis not present

## 2020-07-26 DIAGNOSIS — E1169 Type 2 diabetes mellitus with other specified complication: Secondary | ICD-10-CM | POA: Diagnosis not present

## 2020-07-26 DIAGNOSIS — M8580 Other specified disorders of bone density and structure, unspecified site: Secondary | ICD-10-CM | POA: Diagnosis not present

## 2020-08-23 DIAGNOSIS — E7800 Pure hypercholesterolemia, unspecified: Secondary | ICD-10-CM | POA: Diagnosis not present

## 2020-08-23 DIAGNOSIS — M8580 Other specified disorders of bone density and structure, unspecified site: Secondary | ICD-10-CM | POA: Diagnosis not present

## 2020-08-23 DIAGNOSIS — I10 Essential (primary) hypertension: Secondary | ICD-10-CM | POA: Diagnosis not present

## 2020-08-23 DIAGNOSIS — E1169 Type 2 diabetes mellitus with other specified complication: Secondary | ICD-10-CM | POA: Diagnosis not present

## 2020-08-23 DIAGNOSIS — K219 Gastro-esophageal reflux disease without esophagitis: Secondary | ICD-10-CM | POA: Diagnosis not present

## 2020-10-03 DIAGNOSIS — M8580 Other specified disorders of bone density and structure, unspecified site: Secondary | ICD-10-CM | POA: Diagnosis not present

## 2020-10-03 DIAGNOSIS — I10 Essential (primary) hypertension: Secondary | ICD-10-CM | POA: Diagnosis not present

## 2020-10-03 DIAGNOSIS — E1169 Type 2 diabetes mellitus with other specified complication: Secondary | ICD-10-CM | POA: Diagnosis not present

## 2020-10-03 DIAGNOSIS — E7800 Pure hypercholesterolemia, unspecified: Secondary | ICD-10-CM | POA: Diagnosis not present

## 2020-10-03 DIAGNOSIS — K219 Gastro-esophageal reflux disease without esophagitis: Secondary | ICD-10-CM | POA: Diagnosis not present

## 2020-11-16 DIAGNOSIS — L821 Other seborrheic keratosis: Secondary | ICD-10-CM | POA: Diagnosis not present

## 2020-11-16 DIAGNOSIS — L570 Actinic keratosis: Secondary | ICD-10-CM | POA: Diagnosis not present

## 2020-11-16 DIAGNOSIS — L905 Scar conditions and fibrosis of skin: Secondary | ICD-10-CM | POA: Diagnosis not present

## 2020-11-16 DIAGNOSIS — D225 Melanocytic nevi of trunk: Secondary | ICD-10-CM | POA: Diagnosis not present

## 2020-11-16 DIAGNOSIS — C44619 Basal cell carcinoma of skin of left upper limb, including shoulder: Secondary | ICD-10-CM | POA: Diagnosis not present

## 2020-11-16 DIAGNOSIS — D485 Neoplasm of uncertain behavior of skin: Secondary | ICD-10-CM | POA: Diagnosis not present

## 2020-11-16 DIAGNOSIS — L578 Other skin changes due to chronic exposure to nonionizing radiation: Secondary | ICD-10-CM | POA: Diagnosis not present

## 2020-11-16 DIAGNOSIS — L814 Other melanin hyperpigmentation: Secondary | ICD-10-CM | POA: Diagnosis not present

## 2020-11-16 DIAGNOSIS — Z85828 Personal history of other malignant neoplasm of skin: Secondary | ICD-10-CM | POA: Diagnosis not present

## 2020-11-20 ENCOUNTER — Other Ambulatory Visit: Payer: Self-pay | Admitting: Cardiovascular Disease

## 2020-11-22 DIAGNOSIS — E1169 Type 2 diabetes mellitus with other specified complication: Secondary | ICD-10-CM | POA: Diagnosis not present

## 2020-11-22 DIAGNOSIS — K219 Gastro-esophageal reflux disease without esophagitis: Secondary | ICD-10-CM | POA: Diagnosis not present

## 2020-11-22 DIAGNOSIS — I10 Essential (primary) hypertension: Secondary | ICD-10-CM | POA: Diagnosis not present

## 2020-11-22 DIAGNOSIS — M8580 Other specified disorders of bone density and structure, unspecified site: Secondary | ICD-10-CM | POA: Diagnosis not present

## 2020-11-22 DIAGNOSIS — E7800 Pure hypercholesterolemia, unspecified: Secondary | ICD-10-CM | POA: Diagnosis not present

## 2020-11-27 DIAGNOSIS — I10 Essential (primary) hypertension: Secondary | ICD-10-CM | POA: Diagnosis not present

## 2020-11-27 DIAGNOSIS — E1169 Type 2 diabetes mellitus with other specified complication: Secondary | ICD-10-CM | POA: Diagnosis not present

## 2020-11-27 DIAGNOSIS — K219 Gastro-esophageal reflux disease without esophagitis: Secondary | ICD-10-CM | POA: Diagnosis not present

## 2020-11-27 DIAGNOSIS — E7800 Pure hypercholesterolemia, unspecified: Secondary | ICD-10-CM | POA: Diagnosis not present

## 2020-11-27 DIAGNOSIS — M545 Low back pain, unspecified: Secondary | ICD-10-CM | POA: Diagnosis not present

## 2021-01-08 DIAGNOSIS — C44519 Basal cell carcinoma of skin of other part of trunk: Secondary | ICD-10-CM | POA: Diagnosis not present

## 2021-02-20 DIAGNOSIS — I10 Essential (primary) hypertension: Secondary | ICD-10-CM | POA: Diagnosis not present

## 2021-02-20 DIAGNOSIS — E1169 Type 2 diabetes mellitus with other specified complication: Secondary | ICD-10-CM | POA: Diagnosis not present

## 2021-02-20 DIAGNOSIS — K219 Gastro-esophageal reflux disease without esophagitis: Secondary | ICD-10-CM | POA: Diagnosis not present

## 2021-02-20 DIAGNOSIS — E7800 Pure hypercholesterolemia, unspecified: Secondary | ICD-10-CM | POA: Diagnosis not present

## 2021-02-20 DIAGNOSIS — M8580 Other specified disorders of bone density and structure, unspecified site: Secondary | ICD-10-CM | POA: Diagnosis not present

## 2021-04-02 DIAGNOSIS — I10 Essential (primary) hypertension: Secondary | ICD-10-CM | POA: Diagnosis not present

## 2021-04-02 DIAGNOSIS — E1169 Type 2 diabetes mellitus with other specified complication: Secondary | ICD-10-CM | POA: Diagnosis not present

## 2021-04-02 DIAGNOSIS — M8580 Other specified disorders of bone density and structure, unspecified site: Secondary | ICD-10-CM | POA: Diagnosis not present

## 2021-04-02 DIAGNOSIS — E7800 Pure hypercholesterolemia, unspecified: Secondary | ICD-10-CM | POA: Diagnosis not present

## 2021-04-02 DIAGNOSIS — K219 Gastro-esophageal reflux disease without esophagitis: Secondary | ICD-10-CM | POA: Diagnosis not present

## 2021-04-30 DIAGNOSIS — E1169 Type 2 diabetes mellitus with other specified complication: Secondary | ICD-10-CM | POA: Diagnosis not present

## 2021-05-17 DIAGNOSIS — E1169 Type 2 diabetes mellitus with other specified complication: Secondary | ICD-10-CM | POA: Diagnosis not present

## 2021-05-17 DIAGNOSIS — E7800 Pure hypercholesterolemia, unspecified: Secondary | ICD-10-CM | POA: Diagnosis not present

## 2021-05-17 DIAGNOSIS — I10 Essential (primary) hypertension: Secondary | ICD-10-CM | POA: Diagnosis not present

## 2021-05-17 DIAGNOSIS — K219 Gastro-esophageal reflux disease without esophagitis: Secondary | ICD-10-CM | POA: Diagnosis not present

## 2021-05-17 DIAGNOSIS — M8580 Other specified disorders of bone density and structure, unspecified site: Secondary | ICD-10-CM | POA: Diagnosis not present

## 2021-05-31 ENCOUNTER — Ambulatory Visit: Payer: Medicare HMO | Admitting: Cardiovascular Disease

## 2021-06-08 DIAGNOSIS — Z1231 Encounter for screening mammogram for malignant neoplasm of breast: Secondary | ICD-10-CM | POA: Diagnosis not present

## 2021-06-13 DIAGNOSIS — Z0000 Encounter for general adult medical examination without abnormal findings: Secondary | ICD-10-CM | POA: Diagnosis not present

## 2021-06-13 DIAGNOSIS — I10 Essential (primary) hypertension: Secondary | ICD-10-CM | POA: Diagnosis not present

## 2021-06-13 DIAGNOSIS — E669 Obesity, unspecified: Secondary | ICD-10-CM | POA: Diagnosis not present

## 2021-06-13 DIAGNOSIS — Z1389 Encounter for screening for other disorder: Secondary | ICD-10-CM | POA: Diagnosis not present

## 2021-06-13 DIAGNOSIS — I700 Atherosclerosis of aorta: Secondary | ICD-10-CM | POA: Diagnosis not present

## 2021-06-13 DIAGNOSIS — E7800 Pure hypercholesterolemia, unspecified: Secondary | ICD-10-CM | POA: Diagnosis not present

## 2021-06-13 DIAGNOSIS — Z23 Encounter for immunization: Secondary | ICD-10-CM | POA: Diagnosis not present

## 2021-06-13 DIAGNOSIS — E1169 Type 2 diabetes mellitus with other specified complication: Secondary | ICD-10-CM | POA: Diagnosis not present

## 2021-06-13 DIAGNOSIS — R931 Abnormal findings on diagnostic imaging of heart and coronary circulation: Secondary | ICD-10-CM | POA: Diagnosis not present

## 2021-06-17 DIAGNOSIS — E7800 Pure hypercholesterolemia, unspecified: Secondary | ICD-10-CM | POA: Diagnosis not present

## 2021-06-17 DIAGNOSIS — I10 Essential (primary) hypertension: Secondary | ICD-10-CM | POA: Diagnosis not present

## 2021-06-17 DIAGNOSIS — K219 Gastro-esophageal reflux disease without esophagitis: Secondary | ICD-10-CM | POA: Diagnosis not present

## 2021-06-17 DIAGNOSIS — E1169 Type 2 diabetes mellitus with other specified complication: Secondary | ICD-10-CM | POA: Diagnosis not present

## 2021-06-17 DIAGNOSIS — M8580 Other specified disorders of bone density and structure, unspecified site: Secondary | ICD-10-CM | POA: Diagnosis not present

## 2021-07-03 DIAGNOSIS — M8588 Other specified disorders of bone density and structure, other site: Secondary | ICD-10-CM | POA: Diagnosis not present

## 2021-07-03 DIAGNOSIS — Z13820 Encounter for screening for osteoporosis: Secondary | ICD-10-CM | POA: Diagnosis not present

## 2021-07-03 DIAGNOSIS — Z780 Asymptomatic menopausal state: Secondary | ICD-10-CM | POA: Diagnosis not present

## 2021-07-03 DIAGNOSIS — M8589 Other specified disorders of bone density and structure, multiple sites: Secondary | ICD-10-CM | POA: Diagnosis not present

## 2021-07-04 DIAGNOSIS — H26493 Other secondary cataract, bilateral: Secondary | ICD-10-CM | POA: Diagnosis not present

## 2021-07-04 DIAGNOSIS — E119 Type 2 diabetes mellitus without complications: Secondary | ICD-10-CM | POA: Diagnosis not present

## 2021-07-04 DIAGNOSIS — H31092 Other chorioretinal scars, left eye: Secondary | ICD-10-CM | POA: Diagnosis not present

## 2021-07-04 DIAGNOSIS — H35033 Hypertensive retinopathy, bilateral: Secondary | ICD-10-CM | POA: Diagnosis not present

## 2021-07-10 DIAGNOSIS — M859 Disorder of bone density and structure, unspecified: Secondary | ICD-10-CM | POA: Diagnosis not present

## 2021-07-10 DIAGNOSIS — M8580 Other specified disorders of bone density and structure, unspecified site: Secondary | ICD-10-CM | POA: Diagnosis not present

## 2021-07-17 DIAGNOSIS — M8580 Other specified disorders of bone density and structure, unspecified site: Secondary | ICD-10-CM | POA: Diagnosis not present

## 2021-07-18 DIAGNOSIS — G8929 Other chronic pain: Secondary | ICD-10-CM | POA: Diagnosis not present

## 2021-07-18 DIAGNOSIS — I10 Essential (primary) hypertension: Secondary | ICD-10-CM | POA: Diagnosis not present

## 2021-07-18 DIAGNOSIS — Z791 Long term (current) use of non-steroidal anti-inflammatories (NSAID): Secondary | ICD-10-CM | POA: Diagnosis not present

## 2021-07-18 DIAGNOSIS — E669 Obesity, unspecified: Secondary | ICD-10-CM | POA: Diagnosis not present

## 2021-07-18 DIAGNOSIS — Z7984 Long term (current) use of oral hypoglycemic drugs: Secondary | ICD-10-CM | POA: Diagnosis not present

## 2021-07-18 DIAGNOSIS — Z8249 Family history of ischemic heart disease and other diseases of the circulatory system: Secondary | ICD-10-CM | POA: Diagnosis not present

## 2021-07-18 DIAGNOSIS — Z6832 Body mass index (BMI) 32.0-32.9, adult: Secondary | ICD-10-CM | POA: Diagnosis not present

## 2021-07-18 DIAGNOSIS — K219 Gastro-esophageal reflux disease without esophagitis: Secondary | ICD-10-CM | POA: Diagnosis not present

## 2021-07-18 DIAGNOSIS — E785 Hyperlipidemia, unspecified: Secondary | ICD-10-CM | POA: Diagnosis not present

## 2021-07-18 DIAGNOSIS — M810 Age-related osteoporosis without current pathological fracture: Secondary | ICD-10-CM | POA: Diagnosis not present

## 2021-07-18 DIAGNOSIS — R69 Illness, unspecified: Secondary | ICD-10-CM | POA: Diagnosis not present

## 2021-07-18 DIAGNOSIS — E1165 Type 2 diabetes mellitus with hyperglycemia: Secondary | ICD-10-CM | POA: Diagnosis not present

## 2021-07-18 DIAGNOSIS — Z7982 Long term (current) use of aspirin: Secondary | ICD-10-CM | POA: Diagnosis not present

## 2021-07-18 DIAGNOSIS — M1990 Unspecified osteoarthritis, unspecified site: Secondary | ICD-10-CM | POA: Diagnosis not present

## 2021-08-15 DIAGNOSIS — Z23 Encounter for immunization: Secondary | ICD-10-CM | POA: Diagnosis not present

## 2021-08-15 DIAGNOSIS — L910 Hypertrophic scar: Secondary | ICD-10-CM | POA: Diagnosis not present

## 2021-08-17 ENCOUNTER — Other Ambulatory Visit: Payer: Self-pay | Admitting: Cardiovascular Disease

## 2021-08-21 ENCOUNTER — Other Ambulatory Visit: Payer: Self-pay | Admitting: Cardiovascular Disease

## 2021-08-21 DIAGNOSIS — I10 Essential (primary) hypertension: Secondary | ICD-10-CM | POA: Diagnosis not present

## 2021-08-21 DIAGNOSIS — M8580 Other specified disorders of bone density and structure, unspecified site: Secondary | ICD-10-CM | POA: Diagnosis not present

## 2021-08-21 DIAGNOSIS — E7800 Pure hypercholesterolemia, unspecified: Secondary | ICD-10-CM | POA: Diagnosis not present

## 2021-08-21 DIAGNOSIS — K219 Gastro-esophageal reflux disease without esophagitis: Secondary | ICD-10-CM | POA: Diagnosis not present

## 2021-08-21 DIAGNOSIS — E1169 Type 2 diabetes mellitus with other specified complication: Secondary | ICD-10-CM | POA: Diagnosis not present

## 2021-09-23 NOTE — Progress Notes (Signed)
Cardiology Office Note:   Date:  09/25/2021  NAME:  Cindy Bautista    MRN: [id number] DOB:  1946/01/30   PCP:  Kathyrn Lass, MD  Cardiologist:  Evalina Field, MD  Electrophysiologist:  None   Referring MD: Kathyrn Lass, MD   Chief Complaint  Patient presents with   Follow-up         History of Present Illness:   Cindy Bautista is a 76 y.o. female with a hx of DM, non-obstructive CAD, HLD, A tach who presents for follow-up.  She reports she is doing well.  Denies any chest pain or trouble breathing.  She is walking several miles 3 to 4 days/week.  She recently moved up the leg to that area.  She will start in house.  Her most recent A1c is 7.2.  LDL 39.  Triglycerides 210.  Cholesterol.  She denies any major symptoms office.  No rapid heartbeat sensation.  No recurrence of atrial tachycardia.  Blood pressure is well controlled in office.  Problem List 1. HTN 2. DM -A1c 7.2 3. HLD -Total cholesterol 120, HDL 48, LDL 39, triglycerides 210 4. Non-obstructive CAD -CAC score 388 (84th percentile) -25-49% LAD/RCA 5. Atrial tachycardia  -occurred during CTA  Past Medical History: Past Medical History:  Diagnosis Date   Coronary artery disease    Diabetes mellitus without complication ([place])    Hyperlipidemia    Hypertension     Past Surgical History: Past Surgical History:  Procedure Laterality Date   CESAREAN SECTION      Current Medications: Current Meds  Medication Sig   alendronate (FOSAMAX) 70 MG tablet Take 70 mg by mouth once a week.   aspirin EC 81 MG tablet Take 1 tablet (81 mg total) by mouth daily. Swallow whole.   Calcium Carbonate-Vit D-Min (CALCIUM 600+D PLUS MINERALS) 600-400 MG-UNIT TABS Take by mouth.   Cyanocobalamin (VITAMIN B12) 1000 MCG TBCR Take 1,000 mcg by mouth. Three times weekly   glimepiride (AMARYL) 4 MG tablet Take 4 mg by mouth daily.   Loratadine (CLARITIN PO) Take 1 tablet by mouth.   losartan-hydrochlorothiazide (HYZAAR) 50-12.5 MG  tablet Take 1 tablet by mouth daily.   meloxicam (MOBIC) 7.5 MG tablet Take 7.5 mg by mouth daily. Takes 3 times weekly   metFORMIN (GLUCOPHAGE-XR) 500 MG 24 hr tablet Take by mouth. Takes 4 at night time   metoprolol tartrate (LOPRESSOR) 100 MG tablet Take 1 tablet by mouth once for procedure.   omeprazole (PRILOSEC) 20 MG capsule 1 CAPSULE TAKES WHEN SHE TAKES THE MELOXICAM ORALLY   rosuvastatin (CRESTOR) 20 MG tablet TAKE ONE TABLET BY MOUTH ONCE DAILY   [DISCONTINUED] empagliflozin (JARDIANCE) 10 MG TABS tablet Take 25 mg by mouth daily.     Allergies:    Ace inhibitors   Social History: Social History   Socioeconomic History   Marital status: Divorced    Spouse name: Not on file   Number of children: Not on file   Years of education: Not on file   Highest education level: Not on file  Occupational History   Occupation: physical therapist  Tobacco Use   Smoking status: Never   Smokeless tobacco: Never  Substance and Sexual Activity   Alcohol use: Not Currently   Drug use: Never   Sexual activity: Not on file  Other Topics Concern   Not on file  Social History Narrative   Not on file   Social Determinants of Health   Financial Resource Strain:  Not on file  Food Insecurity: Not on file  Transportation Needs: Not on file  Physical Activity: Not on file  Stress: Not on file  Social Connections: Not on file     Family History: The patient's family history includes Diabetes in her father; Heart attack in her son; Heart disease in her mother; Heart disease (age of onset: 41) in her father; Heart failure in her mother; Hyperlipidemia in her father.  ROS:   All other ROS reviewed and negative. Pertinent positives noted in the HPI.     EKGs/Labs/Other Studies Reviewed:   The following studies were personally reviewed by me today:  CCTA 02/08/2020 IMPRESSION: 1. Coronary calcium score of 388. This was 84th percentile for age and sex matched controls.   2. Normal  coronary origin with right dominance.   3. Significant cardiac motion artifact due to arrhythmia. However, the study was interpretable and demonstrates mild, calcified plaque (25-49%) in the LAD/LCX that is non-obstructive.   RECOMMENDATIONS: 1. Mild non-obstructive CAD (25-49%). Consider non-atherosclerotic causes of chest pain. Consider preventive therapy and risk factor modification.  TTE 01/14/2020  1. Left ventricular ejection fraction, by estimation, is 70 to 75%. The  left ventricle has hyperdynamic function. The left ventricle has no  regional wall motion abnormalities. There is moderate asymmetric left  ventricular hypertrophy of the basal-septal   segment. Left ventricular diastolic parameters are consistent with Grade  I diastolic dysfunction (impaired relaxation). Elevated left atrial  pressure.   2. Right ventricular systolic function is normal. The right ventricular  size is normal.   3. The mitral valve is normal in structure. No evidence of mitral valve  regurgitation. No evidence of mitral stenosis. Severe mitral annular  calcification.   4. The aortic valve is tricuspid. Aortic valve regurgitation is not  visualized. Mild aortic valve sclerosis is present, with no evidence of  aortic valve stenosis.   5. The inferior vena cava is normal in size with greater than 50%  respiratory variability, suggesting right atrial pressure of 3 mmHg.   Recent Labs: No results found for requested labs within last 8760 hours.   Recent Lipid Panel No results found for: CHOL, TRIG, HDL, CHOLHDL, VLDL, LDLCALC, LDLDIRECT  Physical Exam:   VS:  BP 120/62 (Patient Position: Sitting, Cuff Size: Large)    Pulse 86    Ht 5\' 2"  (1.575 m)    Wt 196 lb 12.8 oz (89.3 kg)    SpO2 98%    BMI 36.00 kg/m    Wt Readings from Last 3 Encounters:  09/25/21 196 lb 12.8 oz (89.3 kg)  04/11/20 199 lb (90.3 kg)  02/09/20 205 lb (93 kg)    General: Well nourished, well developed, in no acute  distress Head: Atraumatic, normal size  Eyes: PEERLA, EOMI  Neck: Supple, no JVD Endocrine: No thryomegaly Cardiac: Normal S1, S2; RRR; no murmurs, rubs, or gallops Lungs: Clear to auscultation bilaterally, no wheezing, rhonchi or rales  Abd: Soft, nontender, no hepatomegaly  Ext: No edema, pulses 2+ Musculoskeletal: No deformities, BUE and BLE strength normal and equal Skin: Warm and dry, no rashes   Neuro: Alert and oriented to person, place, time, and situation, CNII-XII grossly intact, no focal deficits  Psych: Normal mood and affect   ASSESSMENT:   Cindy Bautista is a 76 y.o. female who presents for the following: 1. Atrial tachycardia ([place])   2. Coronary artery disease involving native coronary artery of native heart without angina pectoris   3. Agatston  coronary artery calcium score between 200 and 399   4. Mixed hyperlipidemia   5. Essential hypertension     PLAN:   1. Atrial tachycardia ([place]) -Had episode of atrial tachycardia during her coronary CTA.  No further recurrence.  Normal medication.  No symptoms.  2. Coronary artery disease involving native coronary artery of native heart without angina pectoris 3. Agatston coronary artery calcium score between 200 and 399 4. Mixed hyperlipidemia -Mild CAD on coronary CTA.  Coronary calcium score 388 which is 84th percentile.  We will continue with aspirin 81 mg daily.  She is on Crestor 20 mg daily.  LDL cholesterol at goal.  Triglycerides not far.  We will just continue with current regimen.  She is working on her diabetes.  Seems to be controlled.  Blood pressure controlled.  Exercising without symptoms of angina.  She will see [place] yearly.  5. Essential hypertension -Well-controlled.  No change in medications.  Disposition: Return in about 1 year (around 09/25/2022).  Medication Adjustments/Labs and Tests Ordered: Current medicines are reviewed at length with the patient today.  Concerns regarding medicines are outlined  above.  No orders of the defined types were placed in this encounter.  No orders of the defined types were placed in this encounter.   Patient Instructions  Medication Instructions:  The current medical regimen is effective;  continue present plan and medications.  *If you need a refill on your cardiac medications before your next appointment, please call your pharmacy*   Follow-Up: At [facility], you and your health needs are our priority.  As part of our continuing mission to provide you with exceptional heart care, we have created designated Provider Care Teams.  These Care Teams include your primary Cardiologist (physician) and Advanced Practice Providers (APPs -  Physician Assistants and Nurse Practitioners) who all work together to provide you with the care you need, when you need it.  We recommend signing up for the patient portal called "MyChart".  Sign up information is provided on this After Visit Summary.  MyChart is used to connect with patients for Virtual Visits (Telemedicine).  Patients are able to view lab/test results, encounter notes, upcoming appointments, etc.  Non-urgent messages can be sent to your provider as well.   To learn more about what you can do with MyChart, go to [email or web address].    Your next appointment:   12 month(s)  The format for your next appointment:   In Person  Provider:   Evalina Field, MD  or Sande Rives PA-C, or Almyra Deforest, PA-C      Time Spent with Patient: I have spent a total of 25 minutes with patient reviewing hospital notes, telemetry, EKGs, labs and examining the patient as well as establishing an assessment and plan that was discussed with the patient.  > 50% of time was spent in direct patient care.  Signed, Addison Naegeli. Audie Box, MD, [place]  [address] [place], [place] [address] [phone]  09/25/2021 8:36 AM

## 2021-09-25 ENCOUNTER — Encounter: Payer: Self-pay | Admitting: Cardiovascular Disease

## 2021-09-25 ENCOUNTER — Other Ambulatory Visit: Payer: Self-pay

## 2021-09-25 ENCOUNTER — Ambulatory Visit: Payer: Medicare HMO | Admitting: Cardiovascular Disease

## 2021-09-25 VITALS — BP 120/62 | HR 86 | Ht 62.0 in | Wt 196.8 lb

## 2021-09-25 DIAGNOSIS — I2510 Atherosclerotic heart disease of native coronary artery without angina pectoris: Secondary | ICD-10-CM | POA: Diagnosis not present

## 2021-09-25 DIAGNOSIS — I10 Essential (primary) hypertension: Secondary | ICD-10-CM | POA: Diagnosis not present

## 2021-09-25 DIAGNOSIS — E782 Mixed hyperlipidemia: Secondary | ICD-10-CM | POA: Diagnosis not present

## 2021-09-25 DIAGNOSIS — R931 Abnormal findings on diagnostic imaging of heart and coronary circulation: Secondary | ICD-10-CM

## 2021-09-25 DIAGNOSIS — I471 Supraventricular tachycardia: Secondary | ICD-10-CM

## 2021-09-25 NOTE — Patient Instructions (Signed)
Medication Instructions:  The current medical regimen is effective;  continue present plan and medications.  *If you need a refill on your cardiac medications before your next appointment, please call your pharmacy*   Follow-Up: At [facility], you and your health needs are our priority.  As part of our continuing mission to provide you with exceptional heart care, we have created designated Provider Care Teams.  These Care Teams include your primary Cardiologist (physician) and Advanced Practice Providers (APPs -  Physician Assistants and Nurse Practitioners) who all work together to provide you with the care you need, when you need it.  We recommend signing up for the patient portal called "MyChart".  Sign up information is provided on this After Visit Summary.  MyChart is used to connect with patients for Virtual Visits (Telemedicine).  Patients are able to view lab/test results, encounter notes, upcoming appointments, etc.  Non-urgent messages can be sent to your provider as well.   To learn more about what you can do with MyChart, go to [email or web address].    Your next appointment:   12 month(s)  The format for your next appointment:   In Person  Provider:   Evalina Field, MD  or [facility] PA-C, or Almyra Deforest, [place]

## 2021-11-12 ENCOUNTER — Other Ambulatory Visit: Payer: Self-pay | Admitting: Cardiovascular Disease

## 2021-11-20 DIAGNOSIS — L578 Other skin changes due to chronic exposure to nonionizing radiation: Secondary | ICD-10-CM | POA: Diagnosis not present

## 2021-11-20 DIAGNOSIS — Z85828 Personal history of other malignant neoplasm of skin: Secondary | ICD-10-CM | POA: Diagnosis not present

## 2021-11-20 DIAGNOSIS — D225 Melanocytic nevi of trunk: Secondary | ICD-10-CM | POA: Diagnosis not present

## 2021-11-20 DIAGNOSIS — L719 Rosacea, unspecified: Secondary | ICD-10-CM | POA: Diagnosis not present

## 2021-11-20 DIAGNOSIS — L821 Other seborrheic keratosis: Secondary | ICD-10-CM | POA: Diagnosis not present

## 2021-11-20 DIAGNOSIS — L814 Other melanin hyperpigmentation: Secondary | ICD-10-CM | POA: Diagnosis not present

## 2021-12-03 DIAGNOSIS — E119 Type 2 diabetes mellitus without complications: Secondary | ICD-10-CM | POA: Diagnosis not present

## 2021-12-03 DIAGNOSIS — H26493 Other secondary cataract, bilateral: Secondary | ICD-10-CM | POA: Diagnosis not present

## 2021-12-03 DIAGNOSIS — H31092 Other chorioretinal scars, left eye: Secondary | ICD-10-CM | POA: Diagnosis not present

## 2021-12-03 DIAGNOSIS — H35033 Hypertensive retinopathy, bilateral: Secondary | ICD-10-CM | POA: Diagnosis not present

## 2021-12-20 DIAGNOSIS — E1169 Type 2 diabetes mellitus with other specified complication: Secondary | ICD-10-CM | POA: Diagnosis not present

## 2021-12-27 DIAGNOSIS — M8580 Other specified disorders of bone density and structure, unspecified site: Secondary | ICD-10-CM | POA: Diagnosis not present

## 2021-12-27 DIAGNOSIS — E7800 Pure hypercholesterolemia, unspecified: Secondary | ICD-10-CM | POA: Diagnosis not present

## 2021-12-27 DIAGNOSIS — Z6835 Body mass index (BMI) 35.0-35.9, adult: Secondary | ICD-10-CM | POA: Diagnosis not present

## 2021-12-27 DIAGNOSIS — I10 Essential (primary) hypertension: Secondary | ICD-10-CM | POA: Diagnosis not present

## 2021-12-27 DIAGNOSIS — I700 Atherosclerosis of aorta: Secondary | ICD-10-CM | POA: Diagnosis not present

## 2021-12-27 DIAGNOSIS — E1169 Type 2 diabetes mellitus with other specified complication: Secondary | ICD-10-CM | POA: Diagnosis not present

## 2022-06-10 DIAGNOSIS — Z1231 Encounter for screening mammogram for malignant neoplasm of breast: Secondary | ICD-10-CM | POA: Diagnosis not present

## 2022-06-28 DIAGNOSIS — E7800 Pure hypercholesterolemia, unspecified: Secondary | ICD-10-CM | POA: Diagnosis not present

## 2022-06-28 DIAGNOSIS — E1169 Type 2 diabetes mellitus with other specified complication: Secondary | ICD-10-CM | POA: Diagnosis not present

## 2022-07-01 DIAGNOSIS — E7800 Pure hypercholesterolemia, unspecified: Secondary | ICD-10-CM | POA: Diagnosis not present

## 2022-07-01 DIAGNOSIS — I10 Essential (primary) hypertension: Secondary | ICD-10-CM | POA: Diagnosis not present

## 2022-07-01 DIAGNOSIS — Z974 Presence of external hearing-aid: Secondary | ICD-10-CM | POA: Diagnosis not present

## 2022-07-01 DIAGNOSIS — H9113 Presbycusis, bilateral: Secondary | ICD-10-CM | POA: Diagnosis not present

## 2022-07-01 DIAGNOSIS — Z0000 Encounter for general adult medical examination without abnormal findings: Secondary | ICD-10-CM | POA: Diagnosis not present

## 2022-07-01 DIAGNOSIS — Z6832 Body mass index (BMI) 32.0-32.9, adult: Secondary | ICD-10-CM | POA: Diagnosis not present

## 2022-07-01 DIAGNOSIS — M8580 Other specified disorders of bone density and structure, unspecified site: Secondary | ICD-10-CM | POA: Diagnosis not present

## 2022-07-01 DIAGNOSIS — E1169 Type 2 diabetes mellitus with other specified complication: Secondary | ICD-10-CM | POA: Diagnosis not present

## 2022-07-01 DIAGNOSIS — Z23 Encounter for immunization: Secondary | ICD-10-CM | POA: Diagnosis not present

## 2022-07-01 DIAGNOSIS — E669 Obesity, unspecified: Secondary | ICD-10-CM | POA: Diagnosis not present

## 2022-08-26 DIAGNOSIS — H0011 Chalazion right upper eyelid: Secondary | ICD-10-CM | POA: Diagnosis not present

## 2022-09-16 DIAGNOSIS — D485 Neoplasm of uncertain behavior of skin: Secondary | ICD-10-CM | POA: Diagnosis not present

## 2022-09-16 DIAGNOSIS — H0011 Chalazion right upper eyelid: Secondary | ICD-10-CM | POA: Diagnosis not present

## 2022-09-16 DIAGNOSIS — C44311 Basal cell carcinoma of skin of nose: Secondary | ICD-10-CM | POA: Diagnosis not present

## 2022-10-14 DIAGNOSIS — C44311 Basal cell carcinoma of skin of nose: Secondary | ICD-10-CM | POA: Diagnosis not present

## 2022-10-27 NOTE — Progress Notes (Signed)
Cardiology Office Note:   Date:  10/29/2022  NAME:  Cindy Bautista    MRN: [id number] DOB:  May 10, 1946   PCP:  Kathyrn Lass, MD  Cardiologist:  Evalina Field, MD  Electrophysiologist:  None   Referring MD: Kathyrn Lass, MD   Chief Complaint  Patient presents with   Follow-up         History of Present Illness:   Cindy Bautista is a 77 y.o. female with a hx of non-obstructive CAD, HLD, DM, a tach who presents for follow-up.  She reports she is doing well.  Denies any chest pain or trouble breathing.  Walking 2 miles 3 to 4 days/week.  No symptoms of angina.  No shortness of breath.  Overall doing quite well.  LDL 56.  A1c 7.0.  No rapid heartbeat sensation.  No tachycardia reported.  Overall doing well.  Problem List 1. HTN 2. DM -A1c 7.0 3. HLD -Total cholesterol 122, HDL 44, LDL 56, TG 121 4. Non-obstructive CAD -CAC score 388 (84th percentile) -25-49% LAD/RCA 5. Atrial tachycardia  -occurred during CTA  Past Medical History: Past Medical History:  Diagnosis Date   Coronary artery disease    Diabetes mellitus without complication ([place])    Hyperlipidemia    Hypertension     Past Surgical History: Past Surgical History:  Procedure Laterality Date   CESAREAN SECTION      Current Medications: Current Meds  Medication Sig   alendronate (FOSAMAX) 70 MG tablet Take 70 mg by mouth once a week.   aspirin EC 81 MG tablet Take 1 tablet (81 mg total) by mouth daily. Swallow whole.   Calcium Carbonate-Vit D-Min (CALCIUM 600+D PLUS MINERALS) 600-400 MG-UNIT TABS Take by mouth.   Cyanocobalamin (VITAMIN B12) 1000 MCG TBCR Take 1,000 mcg by mouth. Three times weekly   JARDIANCE 25 MG TABS tablet Take 25 mg by mouth daily.   Loratadine (CLARITIN PO) Take 1 tablet by mouth.   losartan-hydrochlorothiazide (HYZAAR) 50-12.5 MG tablet Take 1 tablet by mouth daily.   meloxicam (MOBIC) 7.5 MG tablet Take 7.5 mg by mouth daily. Takes 3 times weekly   metFORMIN (GLUCOPHAGE-XR)  500 MG 24 hr tablet Take by mouth. Takes 4 at night time   metoprolol tartrate (LOPRESSOR) 100 MG tablet Take 1 tablet by mouth once for procedure.   omeprazole (PRILOSEC) 20 MG capsule 1 CAPSULE TAKES WHEN SHE TAKES THE MELOXICAM ORALLY   OZEMPIC, 1 MG/DOSE, 4 MG/3ML SOPN Inject 1 mg into the skin once a week.   rosuvastatin (CRESTOR) 20 MG tablet TAKE ONE TABLET BY MOUTH ONCE DAILY     Allergies:    Ace inhibitors   Social History: Social History   Socioeconomic History   Marital status: Divorced    Spouse name: Not on file   Number of children: Not on file   Years of education: Not on file   Highest education level: Not on file  Occupational History   Occupation: physical therapist  Tobacco Use   Smoking status: Never   Smokeless tobacco: Never  Substance and Sexual Activity   Alcohol use: Not Currently   Drug use: Never   Sexual activity: Not on file  Other Topics Concern   Not on file  Social History Narrative   Not on file   Social Determinants of Health   Financial Resource Strain: Not on file  Food Insecurity: Not on file  Transportation Needs: Not on file  Physical Activity: Not on file  Stress:  Not on file  Social Connections: Not on file     Family History: The patient's family history includes Diabetes in her father; Heart attack in her son; Heart disease in her mother; Heart disease (age of onset: 76) in her father; Heart failure in her mother; Hyperlipidemia in her father.  ROS:   All other ROS reviewed and negative. Pertinent positives noted in the HPI.     EKGs/Labs/Other Studies Reviewed:   The following studies were personally reviewed by me today:  EKG:  EKG is ordered today.  The ekg ordered today demonstrates normal sinus rhythm heart 77, left intrafascicular block, and was personally reviewed by me.   TTE 01/14/2020  1. Left ventricular ejection fraction, by estimation, is 70 to 75%. The  left ventricle has hyperdynamic function. The left  ventricle has no  regional wall motion abnormalities. There is moderate asymmetric left  ventricular hypertrophy of the basal-septal   segment. Left ventricular diastolic parameters are consistent with Grade  I diastolic dysfunction (impaired relaxation). Elevated left atrial  pressure.   2. Right ventricular systolic function is normal. The right ventricular  size is normal.   3. The mitral valve is normal in structure. No evidence of mitral valve  regurgitation. No evidence of mitral stenosis. Severe mitral annular  calcification.   4. The aortic valve is tricuspid. Aortic valve regurgitation is not  visualized. Mild aortic valve sclerosis is present, with no evidence of  aortic valve stenosis.   5. The inferior vena cava is normal in size with greater than 50%  respiratory variability, suggesting right atrial pressure of 3 mmHg.   CCTA 02/08/2020 IMPRESSION: 1. Coronary calcium score of 388. This was 84th percentile for age and sex matched controls.   2. Normal coronary origin with right dominance.   3. Significant cardiac motion artifact due to arrhythmia. However, the study was interpretable and demonstrates mild, calcified plaque (25-49%) in the LAD/LCX that is non-obstructive.  Recent Labs: No results found for requested labs within last 365 days.   Recent Lipid Panel No results found for: "CHOL", "TRIG", "HDL", "CHOLHDL", "VLDL", "LDLCALC", "LDLDIRECT"  Physical Exam:   VS:  BP 112/62   Pulse 77   Ht '5\' 2"'$  (1.575 m)   Wt 176 lb (79.8 kg)   SpO2 94%   BMI 32.19 kg/m    Wt Readings from Last 3 Encounters:  10/29/22 176 lb (79.8 kg)  09/25/21 196 lb 12.8 oz (89.3 kg)  04/11/20 199 lb (90.3 kg)    General: Well nourished, well developed, in no acute distress Head: Atraumatic, normal size  Eyes: PEERLA, EOMI  Neck: Supple, no JVD Endocrine: No thryomegaly Cardiac: Normal S1, S2; RRR; no murmurs, rubs, or gallops Lungs: Clear to auscultation bilaterally, no  wheezing, rhonchi or rales  Abd: Soft, nontender, no hepatomegaly  Ext: No edema, pulses 2+ Musculoskeletal: No deformities, BUE and BLE strength normal and equal Skin: Warm and dry, no rashes   Neuro: Alert and oriented to person, place, time, and situation, CNII-XII grossly intact, no focal deficits  Psych: Normal mood and affect   ASSESSMENT:   Cindy Bautista is a 77 y.o. female who presents for the following: 1. Atrial tachycardia   2. Coronary artery disease involving native coronary artery of native heart without angina pectoris   3. Agatston coronary artery calcium score between 200 and 399   4. Mixed hyperlipidemia     PLAN:   1. Atrial tachycardia -No further episodes.  On no medication  for this.  She will watch it for now.  2. Coronary artery disease involving native coronary artery of native heart without angina pectoris 3. Agatston coronary artery calcium score between 200 and 399 4. Mixed hyperlipidemia -Mild CAD.  Elevated coronary calcium score.  No symptoms of angina.  On appropriate lipid therapy.  LDL at goal.  She will continue to see [place] yearly.  Disposition: Return in about 1 year (around 10/29/2023).  Medication Adjustments/Labs and Tests Ordered: Current medicines are reviewed at length with the patient today.  Concerns regarding medicines are outlined above.  Orders Placed This Encounter  Procedures   EKG 12-Lead   No orders of the defined types were placed in this encounter.   Patient Instructions  Medication Instructions:  The current medical regimen is effective;  continue present plan and medications.  *If you need a refill on your cardiac medications before your next appointment, please call your pharmacy*   Follow-Up: At [facility], you and your health needs are our priority.  As part of our continuing mission to provide you with exceptional heart care, we have created designated Provider Care Teams.  These Care Teams include your  primary Cardiologist (physician) and Advanced Practice Providers (APPs -  Physician Assistants and Nurse Practitioners) who all work together to provide you with the care you need, when you need it.  We recommend signing up for the patient portal called "MyChart".  Sign up information is provided on this After Visit Summary.  MyChart is used to connect with patients for Virtual Visits (Telemedicine).  Patients are able to view lab/test results, encounter notes, upcoming appointments, etc.  Non-urgent messages can be sent to your provider as well.   To learn more about what you can do with MyChart, go to [email or web address].    Your next appointment:   12 month(s)  Provider:   Almyra Deforest, PA-C or Sande Rives, PA-C, or Diona Browner, NP        Time Spent with Patient: I have spent a total of 25 minutes with patient reviewing hospital notes, telemetry, EKGs, labs and examining the patient as well as establishing an assessment and plan that was discussed with the patient.  > 50% of time was spent in direct patient care.  Signed, Addison Naegeli. Audie Box, MD, [facility]  [address] [place], [place] [address] [phone]  10/29/2022 4:14 PM

## 2022-10-29 ENCOUNTER — Encounter: Payer: Self-pay | Admitting: Cardiovascular Disease

## 2022-10-29 ENCOUNTER — Ambulatory Visit: Payer: Medicare HMO | Attending: Cardiovascular Disease | Admitting: Cardiovascular Disease

## 2022-10-29 VITALS — BP 112/62 | HR 77 | Ht 62.0 in | Wt 176.0 lb

## 2022-10-29 DIAGNOSIS — I4719 Other supraventricular tachycardia: Secondary | ICD-10-CM

## 2022-10-29 DIAGNOSIS — E782 Mixed hyperlipidemia: Secondary | ICD-10-CM

## 2022-10-29 DIAGNOSIS — I2510 Atherosclerotic heart disease of native coronary artery without angina pectoris: Secondary | ICD-10-CM

## 2022-10-29 DIAGNOSIS — R931 Abnormal findings on diagnostic imaging of heart and coronary circulation: Secondary | ICD-10-CM

## 2022-10-29 NOTE — Patient Instructions (Signed)
Medication Instructions:  The current medical regimen is effective;  continue present plan and medications.  *If you need a refill on your cardiac medications before your next appointment, please call your pharmacy*   Follow-Up: At [facility], you and your health needs are our priority.  As part of our continuing mission to provide you with exceptional heart care, we have created designated Provider Care Teams.  These Care Teams include your primary Cardiologist (physician) and Advanced Practice Providers (APPs -  Physician Assistants and Nurse Practitioners) who all work together to provide you with the care you need, when you need it.  We recommend signing up for the patient portal called "MyChart".  Sign up information is provided on this After Visit Summary.  MyChart is used to connect with patients for Virtual Visits (Telemedicine).  Patients are able to view lab/test results, encounter notes, upcoming appointments, etc.  Non-urgent messages can be sent to your provider as well.   To learn more about what you can do with MyChart, go to [email or web address].    Your next appointment:   12 month(s)  Provider:   Hao Meng, PA-C or Callie Goodrich, PA-C, or Emily Monge, NP     

## 2022-10-31 DIAGNOSIS — Z85828 Personal history of other malignant neoplasm of skin: Secondary | ICD-10-CM | POA: Diagnosis not present

## 2022-10-31 DIAGNOSIS — Z5189 Encounter for other specified aftercare: Secondary | ICD-10-CM | POA: Diagnosis not present

## 2022-11-06 ENCOUNTER — Other Ambulatory Visit: Payer: Self-pay | Admitting: Cardiovascular Disease

## 2022-12-16 DIAGNOSIS — L814 Other melanin hyperpigmentation: Secondary | ICD-10-CM | POA: Diagnosis not present

## 2022-12-16 DIAGNOSIS — L821 Other seborrheic keratosis: Secondary | ICD-10-CM | POA: Diagnosis not present

## 2022-12-16 DIAGNOSIS — L570 Actinic keratosis: Secondary | ICD-10-CM | POA: Diagnosis not present

## 2022-12-16 DIAGNOSIS — D225 Melanocytic nevi of trunk: Secondary | ICD-10-CM | POA: Diagnosis not present

## 2022-12-16 DIAGNOSIS — Z85828 Personal history of other malignant neoplasm of skin: Secondary | ICD-10-CM | POA: Diagnosis not present

## 2022-12-16 DIAGNOSIS — L578 Other skin changes due to chronic exposure to nonionizing radiation: Secondary | ICD-10-CM | POA: Diagnosis not present

## 2022-12-31 DIAGNOSIS — E1169 Type 2 diabetes mellitus with other specified complication: Secondary | ICD-10-CM | POA: Diagnosis not present

## 2023-01-06 DIAGNOSIS — E119 Type 2 diabetes mellitus without complications: Secondary | ICD-10-CM | POA: Diagnosis not present

## 2023-01-06 DIAGNOSIS — Z6831 Body mass index (BMI) 31.0-31.9, adult: Secondary | ICD-10-CM | POA: Diagnosis not present

## 2023-01-06 DIAGNOSIS — E669 Obesity, unspecified: Secondary | ICD-10-CM | POA: Diagnosis not present

## 2023-01-06 DIAGNOSIS — I4719 Other supraventricular tachycardia: Secondary | ICD-10-CM | POA: Diagnosis not present

## 2023-01-06 DIAGNOSIS — R310 Gross hematuria: Secondary | ICD-10-CM | POA: Diagnosis not present

## 2023-01-22 DIAGNOSIS — H35033 Hypertensive retinopathy, bilateral: Secondary | ICD-10-CM | POA: Diagnosis not present

## 2023-01-22 DIAGNOSIS — H26491 Other secondary cataract, right eye: Secondary | ICD-10-CM | POA: Diagnosis not present

## 2023-01-22 DIAGNOSIS — H0014 Chalazion left upper eyelid: Secondary | ICD-10-CM | POA: Diagnosis not present

## 2023-01-22 DIAGNOSIS — E119 Type 2 diabetes mellitus without complications: Secondary | ICD-10-CM | POA: Diagnosis not present

## 2023-01-23 DIAGNOSIS — R319 Hematuria, unspecified: Secondary | ICD-10-CM | POA: Diagnosis not present

## 2023-06-23 DIAGNOSIS — L570 Actinic keratosis: Secondary | ICD-10-CM | POA: Diagnosis not present

## 2023-06-23 DIAGNOSIS — Z1231 Encounter for screening mammogram for malignant neoplasm of breast: Secondary | ICD-10-CM | POA: Diagnosis not present

## 2023-07-08 DIAGNOSIS — I10 Essential (primary) hypertension: Secondary | ICD-10-CM | POA: Diagnosis not present

## 2023-07-08 DIAGNOSIS — Z23 Encounter for immunization: Secondary | ICD-10-CM | POA: Diagnosis not present

## 2023-07-08 DIAGNOSIS — E669 Obesity, unspecified: Secondary | ICD-10-CM | POA: Diagnosis not present

## 2023-07-08 DIAGNOSIS — E119 Type 2 diabetes mellitus without complications: Secondary | ICD-10-CM | POA: Diagnosis not present

## 2023-07-08 DIAGNOSIS — E7800 Pure hypercholesterolemia, unspecified: Secondary | ICD-10-CM | POA: Diagnosis not present

## 2023-07-08 DIAGNOSIS — Z6832 Body mass index (BMI) 32.0-32.9, adult: Secondary | ICD-10-CM | POA: Diagnosis not present

## 2023-07-08 DIAGNOSIS — Z0000 Encounter for general adult medical examination without abnormal findings: Secondary | ICD-10-CM | POA: Diagnosis not present

## 2023-07-08 DIAGNOSIS — M8580 Other specified disorders of bone density and structure, unspecified site: Secondary | ICD-10-CM | POA: Diagnosis not present

## 2023-10-28 DIAGNOSIS — K648 Other hemorrhoids: Secondary | ICD-10-CM | POA: Diagnosis not present

## 2023-10-28 DIAGNOSIS — Z1211 Encounter for screening for malignant neoplasm of colon: Secondary | ICD-10-CM | POA: Diagnosis not present

## 2023-10-28 DIAGNOSIS — D124 Benign neoplasm of descending colon: Secondary | ICD-10-CM | POA: Diagnosis not present

## 2023-10-28 NOTE — Progress Notes (Unsigned)
 Cardiology Office Note:  .   Date:  10/30/2023  ID:  Cindy Bautista, DOB 1946-01-03, MRN [id number] PCP: Sigmund Hazel, MD  [facility] Providers Cardiologist:  Reatha Harps, MD { History of Present Illness: .    Chief Complaint  Patient presents with   Follow-up    Cindy Bautista is a 78 y.o. female with history of DM, HTN, HLD, non-obstructive CAD who presents for follow-up.    History of Present Illness   The patient is a 78 year old with coronary artery disease who presents for follow-up on coronary calcium score.  She has a history of a coronary calcium score of 388, noted four years ago. She is currently asymptomatic with no chest discomfort or dyspnea and maintains an active lifestyle, walking two to three miles per day, three to four times a week. No episodes of heart racing, and she states that this has improved since her procedure.  Her diabetes is well controlled with an A1c of 7.3. She is on Ozempic, which has helped stabilize her weight. Her weight has remained stable since last year after an initial drop when starting Ozempic.  Her hyperlipidemia is managed with Crestor 20 mg daily, and her most recent LDL cholesterol was 64, which is at goal. She continues to take aspirin without any bruising or bleeding.  Her blood pressure is well controlled at 106/62. She continues to follow a preventive approach to manage her cardiovascular health.  She has a history of non-infractive COPD but does not report any current respiratory symptoms.          Problem List 1. HTN 2. DM -A1c 7.3 3. HLD -T chol 244, HDL 99, LDL 135, TG 49 4. Non-obstructive CAD -CAC score 388 (84th percentile) -25-49% LAD/RCA 5. Atrial tachycardia  -occurred during CTA    ROS: All other ROS reviewed and negative. Pertinent positives noted in the HPI.     Studies Reviewed: Marland Kitchen   EKG Interpretation Date/Time:  Thursday October 30 2023 10:20:08 EDT Ventricular Rate:  66 PR  Interval:  194 QRS Duration:  86 QT Interval:  406 QTC Calculation: 425 R Axis:   -46  Text Interpretation: Sinus rhythm with marked sinus arrhythmia Low voltage QRS Left axis deviation Confirmed by Lennie Odor [phone]) on 10/30/2023 10:25:10 AM    CCTA 02/08/2020 IMPRESSION: 1. Coronary calcium score of 388. This was 84th percentile for age and sex matched controls.   2. Normal coronary origin with right dominance.   3. Significant cardiac motion artifact due to arrhythmia. However, the study was interpretable and demonstrates mild, calcified plaque (25-49%) in the LAD/LCX that is non-obstructive.   RECOMMENDATIONS: 1. Mild non-obstructive CAD (25-49%). Consider non-atherosclerotic causes of chest pain. Consider preventive therapy and risk factor modification. Physical Exam:   VS:  BP 106/62 (BP Location: Left Arm, Patient Position: Sitting)   Pulse 66   Ht 5\' 2"  (1.575 m)   Wt 178 lb (80.7 kg)   BMI 32.56 kg/m    Wt Readings from Last 3 Encounters:  10/30/23 178 lb (80.7 kg)  10/29/22 176 lb (79.8 kg)  09/25/21 196 lb 12.8 oz (89.3 kg)    GEN: Well nourished, well developed in no acute distress NECK: No JVD; No carotid bruits CARDIAC: RRR, no murmurs, rubs, gallops RESPIRATORY:  Clear to auscultation without rales, wheezing or rhonchi  ABDOMEN: Soft, non-tender, non-distended EXTREMITIES:  No edema; No deformity  ASSESSMENT AND PLAN: .   Assessment and Plan  Coronary artery disease, nonobstructive  Coronary artery disease with previous coronary calcium score of 388. Asymptomatic with well-controlled blood pressure and LDL cholesterol below target. - Continue preventive measures including exercise and medication adherence. - Return for evaluation if new symptoms occur. - Follow up with cardiology as needed.  Hyperlipidemia Hyperlipidemia well-managed with LDL cholesterol at 64 mg/dL on Crestor 20 mg daily. - Continue Crestor 20 mg daily. - Maintain LDL  cholesterol below 70 mg/dL.  Hypertension Hypertension well-controlled with blood pressure at 106/62 mmHg. On aspirin therapy without complications. - Continue current antihypertensive regimen and aspirin therapy.  Diabetes mellitus Diabetes well-controlled with HbA1c of 7.3%. On Ozempic, stabilizing weight. - Continue current diabetes management plan including medication adherence and lifestyle modifications. - Monitor blood glucose levels regularly.              Follow-up: No follow-ups on file.  Signed, Lenna Gilford. Flora Lipps, MD, [facility]  [facility]  [address] [place], [place] [address] [phone]  10:46 AM

## 2023-10-30 ENCOUNTER — Encounter: Payer: Self-pay | Admitting: Cardiovascular Disease

## 2023-10-30 ENCOUNTER — Ambulatory Visit: Payer: Medicare HMO | Attending: Cardiovascular Disease | Admitting: Cardiovascular Disease

## 2023-10-30 VITALS — BP 106/62 | HR 66 | Ht 62.0 in | Wt 178.0 lb

## 2023-10-30 DIAGNOSIS — I2510 Atherosclerotic heart disease of native coronary artery without angina pectoris: Secondary | ICD-10-CM | POA: Diagnosis not present

## 2023-10-30 DIAGNOSIS — R931 Abnormal findings on diagnostic imaging of heart and coronary circulation: Secondary | ICD-10-CM | POA: Diagnosis not present

## 2023-10-30 DIAGNOSIS — E782 Mixed hyperlipidemia: Secondary | ICD-10-CM

## 2023-10-30 DIAGNOSIS — I4719 Other supraventricular tachycardia: Secondary | ICD-10-CM | POA: Diagnosis not present

## 2023-10-30 NOTE — Patient Instructions (Signed)
 Medication Instructions:  Your physician recommends that you continue on your current medications as directed. Please refer to the Current Medication list given to you today.    *If you need a refill on your cardiac medications before your next appointment, please call your pharmacy*   Lab Work: NONE    If you have labs (blood work) drawn today and your tests are completely normal, you will receive your results only by: MyChart Message (if you have MyChart) OR A paper copy in the mail If you have any lab test that is abnormal or we need to change your treatment, we will call you to review the results.   Testing/Procedures: NONE    Follow-Up: At [facility], you and your health needs are our priority.  As part of our continuing mission to provide you with exceptional heart care, we have created designated Provider Care Teams.  These Care Teams include your primary Cardiologist (physician) and Advanced Practice Providers (APPs -  Physician Assistants and Nurse Practitioners) who all work together to provide you with the care you need, when you need it.  We recommend signing up for the patient portal called "MyChart".  Sign up information is provided on this After Visit Summary.  MyChart is used to connect with patients for Virtual Visits (Telemedicine).  Patients are able to view lab/test results, encounter notes, upcoming appointments, etc.  Non-urgent messages can be sent to your provider as well.   To learn more about what you can do with MyChart, go to [email or web address].    Your next appointment:   Follow up as needed     Provider:   Reatha Harps, MD    Other Instructions

## 2023-12-16 DIAGNOSIS — C44311 Basal cell carcinoma of skin of nose: Secondary | ICD-10-CM | POA: Diagnosis not present

## 2023-12-16 DIAGNOSIS — L578 Other skin changes due to chronic exposure to nonionizing radiation: Secondary | ICD-10-CM | POA: Diagnosis not present

## 2023-12-16 DIAGNOSIS — L719 Rosacea, unspecified: Secondary | ICD-10-CM | POA: Diagnosis not present

## 2023-12-16 DIAGNOSIS — L814 Other melanin hyperpigmentation: Secondary | ICD-10-CM | POA: Diagnosis not present

## 2023-12-16 DIAGNOSIS — D225 Melanocytic nevi of trunk: Secondary | ICD-10-CM | POA: Diagnosis not present

## 2023-12-16 DIAGNOSIS — D485 Neoplasm of uncertain behavior of skin: Secondary | ICD-10-CM | POA: Diagnosis not present

## 2023-12-16 DIAGNOSIS — Z85828 Personal history of other malignant neoplasm of skin: Secondary | ICD-10-CM | POA: Diagnosis not present

## 2023-12-16 DIAGNOSIS — L821 Other seborrheic keratosis: Secondary | ICD-10-CM | POA: Diagnosis not present

## 2023-12-29 DIAGNOSIS — E1169 Type 2 diabetes mellitus with other specified complication: Secondary | ICD-10-CM | POA: Diagnosis not present

## 2024-01-08 DIAGNOSIS — E119 Type 2 diabetes mellitus without complications: Secondary | ICD-10-CM | POA: Diagnosis not present

## 2024-01-08 DIAGNOSIS — E7800 Pure hypercholesterolemia, unspecified: Secondary | ICD-10-CM | POA: Diagnosis not present

## 2024-01-08 DIAGNOSIS — Z23 Encounter for immunization: Secondary | ICD-10-CM | POA: Diagnosis not present

## 2024-01-08 DIAGNOSIS — E669 Obesity, unspecified: Secondary | ICD-10-CM | POA: Diagnosis not present

## 2024-01-08 DIAGNOSIS — I10 Essential (primary) hypertension: Secondary | ICD-10-CM | POA: Diagnosis not present

## 2024-01-17 DIAGNOSIS — I10 Essential (primary) hypertension: Secondary | ICD-10-CM | POA: Diagnosis not present

## 2024-01-17 DIAGNOSIS — E7800 Pure hypercholesterolemia, unspecified: Secondary | ICD-10-CM | POA: Diagnosis not present

## 2024-01-17 DIAGNOSIS — E119 Type 2 diabetes mellitus without complications: Secondary | ICD-10-CM | POA: Diagnosis not present

## 2024-01-17 DIAGNOSIS — E669 Obesity, unspecified: Secondary | ICD-10-CM | POA: Diagnosis not present

## 2024-02-02 DIAGNOSIS — C44311 Basal cell carcinoma of skin of nose: Secondary | ICD-10-CM | POA: Diagnosis not present

## 2024-02-06 DIAGNOSIS — H31092 Other chorioretinal scars, left eye: Secondary | ICD-10-CM | POA: Diagnosis not present

## 2024-02-06 DIAGNOSIS — H26491 Other secondary cataract, right eye: Secondary | ICD-10-CM | POA: Diagnosis not present

## 2024-02-06 DIAGNOSIS — H35033 Hypertensive retinopathy, bilateral: Secondary | ICD-10-CM | POA: Diagnosis not present

## 2024-02-06 DIAGNOSIS — E119 Type 2 diabetes mellitus without complications: Secondary | ICD-10-CM | POA: Diagnosis not present

## 2024-03-18 DIAGNOSIS — E119 Type 2 diabetes mellitus without complications: Secondary | ICD-10-CM | POA: Diagnosis not present

## 2024-03-18 DIAGNOSIS — I10 Essential (primary) hypertension: Secondary | ICD-10-CM | POA: Diagnosis not present

## 2024-03-18 DIAGNOSIS — E7800 Pure hypercholesterolemia, unspecified: Secondary | ICD-10-CM | POA: Diagnosis not present

## 2024-03-18 DIAGNOSIS — E669 Obesity, unspecified: Secondary | ICD-10-CM | POA: Diagnosis not present

## 2024-04-14 DIAGNOSIS — E119 Type 2 diabetes mellitus without complications: Secondary | ICD-10-CM | POA: Diagnosis not present

## 2024-04-18 DIAGNOSIS — E7800 Pure hypercholesterolemia, unspecified: Secondary | ICD-10-CM | POA: Diagnosis not present

## 2024-04-18 DIAGNOSIS — I10 Essential (primary) hypertension: Secondary | ICD-10-CM | POA: Diagnosis not present

## 2024-04-18 DIAGNOSIS — E119 Type 2 diabetes mellitus without complications: Secondary | ICD-10-CM | POA: Diagnosis not present

## 2024-04-18 DIAGNOSIS — E669 Obesity, unspecified: Secondary | ICD-10-CM | POA: Diagnosis not present

## 2024-05-18 DIAGNOSIS — E669 Obesity, unspecified: Secondary | ICD-10-CM | POA: Diagnosis not present

## 2024-05-18 DIAGNOSIS — E119 Type 2 diabetes mellitus without complications: Secondary | ICD-10-CM | POA: Diagnosis not present

## 2024-05-18 DIAGNOSIS — E7800 Pure hypercholesterolemia, unspecified: Secondary | ICD-10-CM | POA: Diagnosis not present

## 2024-05-18 DIAGNOSIS — I10 Essential (primary) hypertension: Secondary | ICD-10-CM | POA: Diagnosis not present

## 2024-06-18 DIAGNOSIS — E7800 Pure hypercholesterolemia, unspecified: Secondary | ICD-10-CM | POA: Diagnosis not present

## 2024-06-18 DIAGNOSIS — E669 Obesity, unspecified: Secondary | ICD-10-CM | POA: Diagnosis not present

## 2024-06-18 DIAGNOSIS — I10 Essential (primary) hypertension: Secondary | ICD-10-CM | POA: Diagnosis not present

## 2024-06-18 DIAGNOSIS — E119 Type 2 diabetes mellitus without complications: Secondary | ICD-10-CM | POA: Diagnosis not present

## 2024-06-23 DIAGNOSIS — Z1231 Encounter for screening mammogram for malignant neoplasm of breast: Secondary | ICD-10-CM | POA: Diagnosis not present

## 2024-07-18 DIAGNOSIS — E669 Obesity, unspecified: Secondary | ICD-10-CM | POA: Diagnosis not present

## 2024-07-18 DIAGNOSIS — E7800 Pure hypercholesterolemia, unspecified: Secondary | ICD-10-CM | POA: Diagnosis not present

## 2024-07-18 DIAGNOSIS — I10 Essential (primary) hypertension: Secondary | ICD-10-CM | POA: Diagnosis not present

## 2024-07-18 DIAGNOSIS — E119 Type 2 diabetes mellitus without complications: Secondary | ICD-10-CM | POA: Diagnosis not present

## 2024-07-19 DIAGNOSIS — E7800 Pure hypercholesterolemia, unspecified: Secondary | ICD-10-CM | POA: Diagnosis not present

## 2024-07-19 DIAGNOSIS — E1169 Type 2 diabetes mellitus with other specified complication: Secondary | ICD-10-CM | POA: Diagnosis not present

## 2024-07-26 DIAGNOSIS — Z1331 Encounter for screening for depression: Secondary | ICD-10-CM | POA: Diagnosis not present

## 2024-07-26 DIAGNOSIS — M8580 Other specified disorders of bone density and structure, unspecified site: Secondary | ICD-10-CM | POA: Diagnosis not present

## 2024-07-26 DIAGNOSIS — E119 Type 2 diabetes mellitus without complications: Secondary | ICD-10-CM | POA: Diagnosis not present

## 2024-07-26 DIAGNOSIS — K219 Gastro-esophageal reflux disease without esophagitis: Secondary | ICD-10-CM | POA: Diagnosis not present

## 2024-07-26 DIAGNOSIS — Z0000 Encounter for general adult medical examination without abnormal findings: Secondary | ICD-10-CM | POA: Diagnosis not present

## 2024-07-26 DIAGNOSIS — E7800 Pure hypercholesterolemia, unspecified: Secondary | ICD-10-CM | POA: Diagnosis not present

## 2024-07-26 DIAGNOSIS — M545 Low back pain, unspecified: Secondary | ICD-10-CM | POA: Diagnosis not present

## 2024-07-26 DIAGNOSIS — E669 Obesity, unspecified: Secondary | ICD-10-CM | POA: Diagnosis not present

## 2024-07-26 DIAGNOSIS — I10 Essential (primary) hypertension: Secondary | ICD-10-CM | POA: Diagnosis not present

## 2024-07-26 DIAGNOSIS — Z23 Encounter for immunization: Secondary | ICD-10-CM | POA: Diagnosis not present

## 2024-07-26 DIAGNOSIS — Z6832 Body mass index (BMI) 32.0-32.9, adult: Secondary | ICD-10-CM | POA: Diagnosis not present
